# Patient Record
Sex: Female | Born: 1955
Health system: Southern US, Community
[De-identification: ages and names within clinical notes are randomized; demographics above are authoritative.]

## PROBLEM LIST (undated history)

## (undated) DIAGNOSIS — I498 Other specified cardiac arrhythmias: Secondary | ICD-10-CM

## (undated) DIAGNOSIS — F329 Major depressive disorder, single episode, unspecified: Secondary | ICD-10-CM

## (undated) DIAGNOSIS — F32A Depression, unspecified: Secondary | ICD-10-CM

## (undated) DIAGNOSIS — I499 Cardiac arrhythmia, unspecified: Secondary | ICD-10-CM

## (undated) DIAGNOSIS — I10 Essential (primary) hypertension: Secondary | ICD-10-CM

## (undated) HISTORY — PX: CATARACT EXTRACTION: SUR2

---

## 1997-11-17 ENCOUNTER — Ambulatory Visit (HOSPITAL_COMMUNITY): Admission: RE | Admit: 1997-11-17 | Discharge: 1997-11-17 | Payer: Self-pay | Admitting: Obstetrics and Gynecology

## 2000-01-18 ENCOUNTER — Other Ambulatory Visit: Admission: RE | Admit: 2000-01-18 | Discharge: 2000-01-18 | Payer: Self-pay | Admitting: Obstetrics and Gynecology

## 2001-03-13 ENCOUNTER — Other Ambulatory Visit: Admission: RE | Admit: 2001-03-13 | Discharge: 2001-03-13 | Payer: Self-pay | Admitting: Obstetrics and Gynecology

## 2004-02-27 ENCOUNTER — Other Ambulatory Visit: Admission: RE | Admit: 2004-02-27 | Discharge: 2004-02-27 | Payer: Self-pay | Admitting: Obstetrics and Gynecology

## 2004-07-07 ENCOUNTER — Encounter (INDEPENDENT_AMBULATORY_CARE_PROVIDER_SITE_OTHER): Payer: Self-pay | Admitting: *Deleted

## 2004-07-07 ENCOUNTER — Ambulatory Visit (HOSPITAL_COMMUNITY): Admission: RE | Admit: 2004-07-07 | Discharge: 2004-07-07 | Payer: Self-pay | Admitting: Obstetrics and Gynecology

## 2005-09-14 ENCOUNTER — Other Ambulatory Visit: Admission: RE | Admit: 2005-09-14 | Discharge: 2005-09-14 | Payer: Self-pay | Admitting: Obstetrics and Gynecology

## 2006-03-17 ENCOUNTER — Ambulatory Visit: Payer: Self-pay | Admitting: Family Medicine

## 2006-09-21 ENCOUNTER — Ambulatory Visit: Payer: Self-pay | Admitting: Internal Medicine

## 2006-09-27 ENCOUNTER — Ambulatory Visit (HOSPITAL_COMMUNITY): Admission: RE | Admit: 2006-09-27 | Discharge: 2006-09-27 | Payer: Self-pay | Admitting: Internal Medicine

## 2006-09-27 ENCOUNTER — Ambulatory Visit: Payer: Self-pay | Admitting: Cardiology

## 2006-10-27 ENCOUNTER — Ambulatory Visit: Payer: Self-pay | Admitting: Internal Medicine

## 2006-10-31 ENCOUNTER — Encounter: Admission: RE | Admit: 2006-10-31 | Discharge: 2006-10-31 | Payer: Self-pay | Admitting: Internal Medicine

## 2006-11-08 ENCOUNTER — Encounter: Admission: RE | Admit: 2006-11-08 | Discharge: 2006-11-08 | Payer: Self-pay | Admitting: Internal Medicine

## 2006-11-15 ENCOUNTER — Ambulatory Visit (HOSPITAL_COMMUNITY): Admission: RE | Admit: 2006-11-15 | Discharge: 2006-11-15 | Payer: Self-pay | Admitting: Internal Medicine

## 2006-11-27 ENCOUNTER — Ambulatory Visit: Payer: Self-pay | Admitting: Internal Medicine

## 2008-01-14 ENCOUNTER — Ambulatory Visit (HOSPITAL_COMMUNITY): Admission: RE | Admit: 2008-01-14 | Discharge: 2008-01-14 | Payer: Self-pay | Admitting: Chiropractic Medicine

## 2008-09-26 HISTORY — PX: CERVICAL DISCECTOMY: SHX98

## 2009-02-22 ENCOUNTER — Observation Stay (HOSPITAL_COMMUNITY): Admission: EM | Admit: 2009-02-22 | Discharge: 2009-02-23 | Payer: Self-pay | Admitting: Emergency Medicine

## 2009-03-19 ENCOUNTER — Ambulatory Visit (HOSPITAL_COMMUNITY): Admission: RE | Admit: 2009-03-19 | Discharge: 2009-03-20 | Payer: Self-pay | Admitting: Neurological Surgery

## 2009-04-21 ENCOUNTER — Encounter: Admission: RE | Admit: 2009-04-21 | Discharge: 2009-04-21 | Payer: Self-pay | Admitting: Neurological Surgery

## 2009-06-23 ENCOUNTER — Encounter: Admission: RE | Admit: 2009-06-23 | Discharge: 2009-06-23 | Payer: Self-pay | Admitting: Neurological Surgery

## 2009-08-28 ENCOUNTER — Other Ambulatory Visit: Admission: RE | Admit: 2009-08-28 | Discharge: 2009-08-28 | Payer: Self-pay | Admitting: Family Medicine

## 2009-09-22 ENCOUNTER — Encounter: Admission: RE | Admit: 2009-09-22 | Discharge: 2009-09-22 | Payer: Self-pay | Admitting: Neurological Surgery

## 2010-10-17 ENCOUNTER — Encounter: Payer: Self-pay | Admitting: Gastroenterology

## 2010-10-17 ENCOUNTER — Encounter: Payer: Self-pay | Admitting: Endocrinology

## 2010-10-17 ENCOUNTER — Encounter: Payer: Self-pay | Admitting: Internal Medicine

## 2011-01-03 LAB — BASIC METABOLIC PANEL
BUN: 12 mg/dL (ref 6–23)
CO2: 31 mEq/L (ref 19–32)
Chloride: 106 mEq/L (ref 96–112)
Potassium: 4.4 mEq/L (ref 3.5–5.1)

## 2011-01-03 LAB — DIFFERENTIAL
Eosinophils Relative: 2 % (ref 0–5)
Lymphocytes Relative: 27 % (ref 12–46)
Lymphs Abs: 1.8 10*3/uL (ref 0.7–4.0)
Monocytes Absolute: 0.1 10*3/uL (ref 0.1–1.0)

## 2011-01-03 LAB — CBC
HCT: 41.5 % (ref 36.0–46.0)
Hemoglobin: 14.2 g/dL (ref 12.0–15.0)
MCV: 92.1 fL (ref 78.0–100.0)
RDW: 12.6 % (ref 11.5–15.5)
WBC: 6.6 10*3/uL (ref 4.0–10.5)

## 2011-01-04 LAB — POCT I-STAT, CHEM 8
BUN: 19 mg/dL (ref 6–23)
Calcium, Ion: 1.08 mmol/L — ABNORMAL LOW (ref 1.12–1.32)
Chloride: 104 mEq/L (ref 96–112)
Creatinine, Ser: 0.9 mg/dL (ref 0.4–1.2)
Glucose, Bld: 146 mg/dL — ABNORMAL HIGH (ref 70–99)

## 2011-01-04 LAB — ETHANOL

## 2011-01-04 LAB — CBC
HCT: 43.4 % (ref 36.0–46.0)
Hemoglobin: 14.7 g/dL (ref 12.0–15.0)
WBC: 14.1 10*3/uL — ABNORMAL HIGH (ref 4.0–10.5)
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
## 2011-02-11 NOTE — H&P (Signed)
NAME:  Deborah [name], Deborah [name]                ACCOUNT NO.:  [id number]   MEDICAL RECORD NO.:  [id number]          PATIENT TYPE:  AMB   LOCATION:  SDC                           FACILITY:  [facility]   PHYSICIAN:  Dineen Kid. Rana Snare, M.D.    DATE OF BIRTH:  01-Nov-1955   DATE OF ADMISSION:  07/07/2004  DATE OF DISCHARGE:                                HISTORY & PHYSICAL   HISTORY OF PRESENT ILLNESS:  Ms. Deborah [name] is a 55 year old nulligravida white  female who presents to me complaining of irregular cycles.  She has had a  previous evaluation for this, and was placed on cyclical Provera which  brought on her menses, but then she bled for 3 weeks, and it finally  stopped.  She does not have any menopausal symptoms.  She does have some  anxiety, and she is currently on Zoloft for that.  She underwent a  sonohystogram, which shows intracavitary lesions consistent with 2 polyps.  She also has some intramural fibroids, the polyps with the largest measuring  6 mm in size in the posterior endometrium.  She presents today for  definitive surgical evaluation and removal of these, planned hysteroscopy  D&C.   PAST MEDICAL HISTORY:  1.  Significant for overactive bladder.  2.  Anxiety.  3.  Headaches.  4.  Insomnia.   CURRENT MEDICATIONS:  1.  Zoloft 150 mg daily.  2.  Lunesta for sleep.   ALLERGIES:  1.  CODEINE.  2.  SULFA.   PHYSICAL EXAMINATION:  VITAL SIGNS:  Blood pressure is 120/82.  HEART:  Regular rate and rhythm.  LUNGS:  Clear to auscultation bilaterally.  ABDOMEN:  Nondistended, nontender.  PELVIC:  The uterus is anteverted, mobile.  It is 8 weeks size.   Ultrasound shows the uterus measuring 8.17 x 4 x 4.3.  Several intramural  fibroids, with the largest measuring 1.3 cm in size.  The ovaries are normal  in appearance, with a small simple cyst 2.9 cm in size noted on the right  ovary.  Two polypoid masses are seen within the endometrial cavity, with the  largest measuring 6 mm in size.   IMPRESSION AND PLAN:  Menometrorrhagia, fibroids, and endometrial mass,  consistent with polyp.  Plan hysteroscopy D&C for evaluation and removal of  this.  Risks and benefits were discussed at length, which include,  but are not limited to, risk of infection, bleeding, damage to uterus,  tubes, ovaries, bowel, bladder, the possibility this may not alleviate the  bleeding, or possibly this could recur in the future, or require further  surgery.  Her questions were answered.  She has given her informed consent.      DCL/MEDQ  D:  07/06/2004  T:  07/06/2004  Job:  [id number]

## 2011-02-11 NOTE — Assessment & Plan Note (Signed)
[facility]                             PULMONARY OFFICE NOTE   NAME:[name], Deborah                         MRN:          [id number]  DATE:11/14/2006                            DOB:          [birth year]    PROBLEM:  1. Hemoptysis with dyspnea.  2. Multinodular goiter.  3. Hepatic mass.  4. Thoracolumbar disc disease.   HISTORY:  The patient was not seen today but I spoke with her by  telephone and this note is to document for the file.   1. Her original presenting complaint was repeated episodes of      hemoptysis, primarily associated with being on the bottom during      sexual  intercourse.  CT scan of the chest with contrast on September 27, 2006, at the [facility] machine showed ill-defined patchy foci of      ground-glass attenuation in the right upper lobe without other      significant chest findings.  Incidental note was also made of a      left thyroid mass and of a right hepatic lobe mass.  After followup      discussions with her, an ultrasound soft tissue of the head and      neck was done at [facility] on February 13 with results      consistent with a multinodular goiter.  Endocrine appointment has      been made with Dr. Dorisann Frames to follow up this issue.  The      liver lesion was again seen on MR of the abdomen with and without      contrast at [facility] on October 31, 2006.  This described      a 2.1 x 1.6 cm right posterior hepatic lesion.  The radiologist      favored an atypical appearance of an area of focal nodular      hyperplasia and suggested followup MRI in 3-6 months.  She has no      history of liver disease or malignancy.  In discussing this, she      preferred my offer of GI referral for consultation about best      evaluation for liver lesion.  Appointment is being made with      [facility].  2. She is aware of some degenerative problems in her back and no      immediate workup was suggested for  her disc disease, thought by the      radiologist to be at T12-L1.  3. With nonspecific chest x-ray and CT scan findings of the chest, I      have suggested the possibility that the patchy foci of ground-glass      attentuation described in the right upper lobe might represent      hemorrhage.  One possibility would be that pressure on the abdomen      and chest raises intrathoracic blood pressure, probably in the      pulmonary arterial bed, causing hemorrhage.  Since this is  obviously a distressing symptom for her, she is going to go forward      with a pulmonary angiogram, looking in particular for the      possibility of aneurysms or an arteriovenous malformation that      might offer potential for selective embolization or other direct      treatment in the future.  She will keep her scheduled followup      appointment.     Clinton D. Maple Hudson, MD, Tonny Bollman, FACP  Electronically Signed    CDY/MedQ  DD: 11/14/2006  DT: 11/15/2006  Job #: [id number]

## 2011-02-11 NOTE — Assessment & Plan Note (Signed)
[facility]                             PULMONARY OFFICE NOTE   NAME:CLAPP, Deborah [name]                       MRN:          [id number]  DATE:10/27/2006                            DOB:          1956-06-20    PULMONARY FOLLOWUP:   PROBLEM LIST:  Hemoptysis with dyspnea.   HISTORY:  She had originally come because of episodes of dyspnea with  hemoptysis experienced only if she had the bottom position during  intercourse suggesting [name] mechanical pressure effect.  There has been no  change and she feels well and comfortable.   MEDICATIONS:  1. Zoloft 100 mg.  2. Albuterol inhaler.  3. Allegra 180.  4. Excedrin are used p.r.n.   DRUG INTOLERANCE:  1. SULFA.  2. CODEINE.   OBJECTIVE:  VITAL SIGNS:  Weight 154 pounds, BP 142/84, pulse rate 77,  room air saturation 99%.  GENERAL:  There is perhaps mild abdominal obesity, otherwise  unremarkable.  She seems relaxed and comfortable.  HEENT:  Conjunctivae are clear.  Nasal mucosa looks normal.  Oropharynx  is clear.  NECK:  There is no neck vein distention or stridor.  In retrospect I can  appreciate [name] slight fullness of the left lobe of the thyroid.  There are  no bruits at the neck.  HEART:  Sounds are regular without murmur or gallop.  LUNGS:  Fields are clear.  Breathing is unlabored.  ABDOMEN:  I cannot feel enlargement of the liver or spleen.  EXTREMITIES:  Without cyanosis, clubbing, or edema.   LABORATORY:  CBC, coagulation profile, and comprehensive metabolic panel  were all normal.  Pulmonary function tests at [facility] on September 27, 2006,  showed normal spirometry, possibly minimal small airway obstruction with  insignificant response to bronchodilator, normal lung volumes, normal  diffusion.  CT of the chest with contrast at the [facility] on  September 27, 2006, showed ill defined patchy foci of ground glass  attenuation in the right upper lobe.  Given her history, areas of  hemoptysis or  hemorrhage would be [name] consideration.  There was no  evidence of an endobronchial lesion.  [name] little linear scarring at the  left posterior lung base.  There was [name] 1.4 x 2.3-cm oval enhancing  vascular lesion in the posterior aspect of the posterior segment of the  right hepatic lobe, possibility being focal nodular hyperplasia adenoma  or hemangioma according to the radiologist who suggested consideration  of an MRI or followup CT.  There was [name] 1.7 x 1.8-cm solid nodule/mass in  the inferior pole of the left thyroid and minute low density focus in  the right thyroid suggesting thyroid ultrasound.  These were reviewed  with her.   IMPRESSION:  1. Positional dyspnea with hemoptysis.  CT suggests the possibility      that she has an arterial venous malformation in the right upper      lobe and that increased pulmonary vascular pressure may cause      intermittent bleeding.  Further evaluation of this might require [name]      pulmonary angiogram.  2. Thyroid mass.  3. Liver mass.   PLAN:  1. Thyroid ultrasound.  2. MRI of the abdomen/liver.  3. Schedule return one month, earlier p.r.n.     Clinton D. Maple Hudson, MD, Tonny Bollman, FACP  Electronically Signed    CDY/MedQ  DD: 10/28/2006  DT: 10/28/2006  Job #: [id number]

## 2011-02-11 NOTE — Op Note (Signed)
NAME:  CLAPP, Keyanni                ACCOUNT NO.:  [id number]   MEDICAL RECORD NO.:  [id number]          PATIENT TYPE:  AMB   LOCATION:  SDC                           FACILITY:  [facility]   PHYSICIAN:  Dineen Kid. Rana Snare, M.D.    DATE OF BIRTH:  1956-03-21   DATE OF PROCEDURE:  07/07/2004  DATE OF DISCHARGE:                                 OPERATIVE REPORT   PREOPERATIVE DIAGNOSES:  1.  Abnormal uterine bleeding.  2.  Endometrial mass consistent with polyps.  3.  Fibroid.   POSTOPERATIVE DIAGNOSES:  1.  Abnormal uterine bleeding.  2.  Endometrial mass consistent with polyps.  3.  Fibroid.   PROCEDURES:  Hysteroscopy, dilatation and curettage.   SURGEON:  Dineen Kid. Rana Snare, M.D.   ANESTHESIA:  Monitored anesthetic care and paracervical block.   INDICATIONS:  Ms. Coralee North is a 55 year old nulligravida with worsening  bleeding, actually made worse with hormonal therapy.  Underwent a  sonohysterogram, which shows intramural fibroids but also two endometrial  masses consistent with polyps.  She presents today for hysteroscopy, D&C,  for evaluation and removal of these.  Risks and benefits were discussed,  informed consent was obtained.   Findings at the time of surgery were two posterior endometrial wall  thickenings consistent with early polyps, otherwise normal-appearing tissue  and normal-appearing endometrial cavity after D&C.   DESCRIPTION OF PROCEDURE:  After adequate analgesia, the patient placed in  the dorsal lithotomy position, where she is sterilely prepped and draped,  the bladder is sterilely drained, a Graves speculum is placed.  A tenaculum  is placed on the anterior lip of the cervix.  A paracervical block was  placed.  The uterus is sounded to 7 cm.  The cervix is dilated to a #27  Pratt dilator.  The hysteroscope is inserted and the above findings were  noted.  Sharp curettage was performed, retrieving small fragments of  endometrium and probable early endometrial polyps.  This  is performed until  a gritty surface is felt throughout the endometrial cavity, followed by  hysteroscopy revealing a normal-appearing endometrial cavity and no residual  polyps noted.  The cervix was also noted to be normal.  The hysteroscope was  removed.  Hemostasis was achieved within the cervix with Monsel's solution.  The tenaculum was removed from the cervix and noted to be hemostatic.  The  speculum was then removed and the patient was transferred to the recovery  room in stable condition.  Estimated blood loss from the procedure was 20  mL, sorbitol deficit 10 mL.  The patient received 30 mg of Toradol in the OR  as well.   DISPOSITION:  The patient will be discharged home, will follow up in the  office in two to three weeks, sent home with a routine instruction sheet for  D&C.  Told to return for increased pain, fever, or bleeding.      DCL/MEDQ  D:  07/07/2004  T:  07/07/2004  Job:  [id number]

## 2011-02-11 NOTE — Assessment & Plan Note (Signed)
[facility]                             PULMONARY OFFICE NOTE   NAME:[name], [name]                       MRN:          [id number]  DATE:09/21/2006                            DOB:          05/11/1956    PROBLEM:  A 55 year old woman, self-referred, because of hemoptysis. She  is changing primary physicians and currently un-established.   HISTORY:  She gives a long history of second-hand smoke exposure and had  smoked herself as a young adult, but has had no known lung disease.  Since June of this year, she has had 3 separate episodes of similar  pattern where she felt a bubbling in her chest, shortness of breath, and  then red blood from her mouth while having intercourse. She feels it is  the weight of her partner somehow causing the bleeding. On the first  occasion, she sat up all night and the bleeding gradually stopped. On  the other two occasions, it lasted for shorter length of time. There is  no association with menses and there has been no other bleeding. She had  no similar previous experience. A nurse practitioner at her former  primary office sent her for a chest x-ray and she brings a mini copy  done at [place], but not dated. I see no obvious lesion on the PA and  lateral fields represented. She says since the first of these episodes,  she has had a sense that her chest was more tight than usual. She has  not been waking smothered. Perhaps unrelated, she was noted to be in  asymptomatic ventricular trigeminy during a routine colonoscopy while  lying on her side earlier this year. She has not needed to sleep propped  up. She has not had chest pain or palpitations.   MEDICATIONS:  1. Zoloft 100 mg.  2. Albuterol rescue inhaler given for the present complaint.  3. Allegra 180 mg.  4. Occasional Excedrin.   DRUG INTOLERANCES:  TO SULFA AND TO CODEINE.   REVIEW OF SYSTEMS:  She feels she is perimenopausal with occasional  night sweats  and hot flashes. Weight has been stable. No adenopathy or  rash. No other bleeding. No chest pain or palpitations, syncope or  confusion. No change in bowel or bladder.   PAST HISTORY:  No significant health problems. Seasonal rhinitis and  some persistent sense of sinus congestion. Remote gastric ulcer. No  history of lung disease, endometriosis, cardiac, liver or kidney  disease, diabetes or cancer. No clotting disorder. A PPD was negative.   SOCIAL HISTORY:  She smoked between ages 66 and 47. She grew up in a  smoking household and lived with smokers through much of her adult life.  She is divorced with no children. Now dating a patient of ours who  referred her here. She works at a dialysis center.   FAMILY HISTORY:  Several smokers with COPD. Nobody with a bleeding or  clotting disorder.   OBJECTIVE:  Weight 157 pounds. BP 138/82; pulse regular at 68.  Room air  saturation 100% at rest on room air. This is  a well-developed, well-  nourished, pleasant, calm woman.  SKIN: No rash. No telangiectasias at the nail beds.  No adenopathy.  HEENT: Conjunctivae are clear. Nasal mucosa looks normal. Oropharynx is  normal.  There is no neck vein distention, stridor or thyromegaly.  CHEST: Quiet, clear lung fields. No rales, rhonchi, wheeze or cough.  Heart sounds are regular without murmur or gallop. I do not hear an  obvious bruit over the chest.  ABDOMEN: No enlargement of liver or spleen.  EXTREMITIES: No cyanosis, clubbing or edema.   LABORATORY DATA:  As she left, she was sent down for blood work, which  is available by the time of this dictation, showing a completely normal  CBC with hemoglobin of 13.4, normal coagulation panel with an INR of 1  and normal chemistry panel except that alkaline phosphatase is  borderline low at 37. Other liver enzymes and renal function are normal.   IMPRESSION:  Hemoptysis, specifically associated with intercourse with  partner on top of her  suggesting that there is a mechanical or pressure  effect. An endobronchial carcinoid or similar vascular tumor is not  excluded, but this may be simply an arteriovenous malformation.   PLAN:  1. Blood work was drawn and is reported.  2. Schedule CT of the chest with contrast.  3. Schedule PFT.  4. Schedule return 1 month, earlier as needed. We have discussed      possible bronchoscopy.     Clinton D. Maple Hudson, MD, Tonny Bollman, FACP  Electronically Signed    CDY/MedQ  DD: 09/21/2006  DT: 09/21/2006  Job #: [id number]

## 2011-02-11 NOTE — Assessment & Plan Note (Signed)
[facility]                             PULMONARY OFFICE NOTE   NAME:[name], [name]                       MRN:          [id number]  DATE:11/27/2006                            DOB:          [birth year]    PROBLEMS:  1. Hemoptysis with dyspnea.  2. Multinodular goiter.  3. Hepatic mass.  4. Thoracolumbar disk disease.  5. Mild pulmonary hypertension.   HISTORY:  Her pulmonary angiogram did not suggest an AV malformation or  other bleeding source.  She had some ectopy, apparently, during the  test.  It demonstrated mild pulmonary hypertension at 45/19 with a mean  PA pressure of 30 mm.  She is pending a visit to Dr. Talmage Nap for a thyroid  nodule.  We had discussed the hepatic lesion seen on MR of the abdomen,  done at [facility] on February 5th and described as a 2.1 x 1.6  cm right posterior hepatic lesion.  Incidentally, she was also seen to  have some disk disease which is asymptomatic at the time.  I offered GI  referral for evaluation of the liver, and she wanted this done.  There  has been no more hemoptysis.   MEDICATIONS:  1. Zoloft 100 mg.  2. Albuterol rescue inhaler used p.r.n.  3. Allegra 180 mg p.r.n.  4. Excedrin p.r.n.   DRUG INTOLERANCE:  SULFA, CODEINE.   OBJECTIVE:  VITAL SIGNS:  Weight 158 pounds.  BP 126/82, pulse regular  at 88, room air saturation 98%.  GENERAL:  She looks well.  There is no obvious rash or adenopathy.  Nailbeds are normal.  I do not see telangiectases.  HEENT:  Nose and  throat are clear.  LUNGS:  Lung fields sound clear.  HEART:  The heart sounds regular without murmur.  ABDOMEN:  I do not hear a bruit over her abdomen.   IMPRESSION:  Hemoptysis was clearly related to mechanical pressure in  the bottom position during intercourse, but we have not found a bleeding  source.  At the present time, she simply will avoid that position.  Incidental problems, as noted above, have appeared during the  workup.  She is being evaluated by Dr. Talmage Nap for the thyroid nodules, and she had  previously seen Dr. Vida Rigger for GI, so we will help her get back to  him for evaluation of the liver lesion seen on  MR of the abdomen.  Schedule return here in three months.  If there is  further hemoptysis, we will offer bronchoscopy.     Clinton D. Maple Hudson, MD, Tonny Bollman, FACP  Electronically Signed    CDY/MedQ  DD: 11/30/2006  DT: 12/01/2006  Job #: [id number]   cc:   Dorisann Frames, M.D.  Petra Kuba, M.D.

## 2011-07-11 ENCOUNTER — Other Ambulatory Visit: Payer: Self-pay | Admitting: Family Medicine

## 2011-07-11 ENCOUNTER — Other Ambulatory Visit (HOSPITAL_COMMUNITY)
Admission: RE | Admit: 2011-07-11 | Discharge: 2011-07-11 | Disposition: A | Discharge status: Home / Self Care | Payer: BC Managed Care – PPO | Source: Ambulatory Visit | Attending: Family Medicine | Admitting: Family Medicine

## 2011-07-11 DIAGNOSIS — Z124 Encounter for screening for malignant neoplasm of cervix: Secondary | ICD-10-CM | POA: Insufficient documentation

## 2012-03-15 ENCOUNTER — Emergency Department (HOSPITAL_COMMUNITY)
Admission: EM | Admit: 2012-03-15 | Discharge: 2012-03-16 | Disposition: A | Discharge status: Home / Self Care | Payer: BC Managed Care – PPO | Attending: Emergency Medicine | Admitting: Emergency Medicine

## 2012-03-15 ENCOUNTER — Emergency Department (HOSPITAL_COMMUNITY)
Admission: EM | Admit: 2012-03-15 | Discharge: 2012-03-15 | Disposition: A | Discharge status: Nursing Home (Includes ICF) | Payer: BC Managed Care – PPO | Source: Home / Self Care | Attending: Emergency Medicine | Admitting: Emergency Medicine

## 2012-03-15 ENCOUNTER — Encounter (HOSPITAL_COMMUNITY): Payer: Self-pay | Admitting: Emergency Medicine

## 2012-03-15 DIAGNOSIS — I498 Other specified cardiac arrhythmias: Secondary | ICD-10-CM | POA: Insufficient documentation

## 2012-03-15 DIAGNOSIS — R202 Paresthesia of skin: Secondary | ICD-10-CM

## 2012-03-15 DIAGNOSIS — R209 Unspecified disturbances of skin sensation: Secondary | ICD-10-CM

## 2012-03-15 DIAGNOSIS — Z79899 Other long term (current) drug therapy: Secondary | ICD-10-CM | POA: Insufficient documentation

## 2012-03-15 DIAGNOSIS — IMO0001 Reserved for inherently not codable concepts without codable children: Secondary | ICD-10-CM

## 2012-03-15 DIAGNOSIS — F3289 Other specified depressive episodes: Secondary | ICD-10-CM | POA: Insufficient documentation

## 2012-03-15 DIAGNOSIS — I10 Essential (primary) hypertension: Secondary | ICD-10-CM | POA: Insufficient documentation

## 2012-03-15 DIAGNOSIS — F329 Major depressive disorder, single episode, unspecified: Secondary | ICD-10-CM | POA: Insufficient documentation

## 2012-03-15 HISTORY — DX: Other specified cardiac arrhythmias: I49.8

## 2012-03-15 HISTORY — DX: Essential (primary) hypertension: I10

## 2012-03-15 HISTORY — DX: Cardiac arrhythmia, unspecified: I49.9

## 2012-03-15 HISTORY — DX: Major depressive disorder, single episode, unspecified: F32.9

## 2012-03-15 HISTORY — DX: Depression, unspecified: F32.A

## 2012-03-15 LAB — POCT I-STAT, CHEM 8
BUN: 17 mg/dL (ref 6–23)
Calcium, Ion: 1.2 mmol/L (ref 1.12–1.32)
Chloride: 102 meq/L (ref 96–112)
Creatinine, Ser: 0.9 mg/dL (ref 0.50–1.10)
Glucose, Bld: 83 mg/dL (ref 70–99)
HCT: 40 % (ref 36.0–46.0)
Hemoglobin: 13.6 g/dL (ref 12.0–15.0)
Potassium: 3.8 meq/L (ref 3.5–5.1)
Sodium: 143 meq/L (ref 135–145)
TCO2: 28 mmol/L (ref 0–100)

## 2012-03-15 NOTE — ED Provider Notes (Signed)
History     CSN: [id number]  Arrival date & time 03/15/12  1840   First MD Initiated Contact with Patient 03/15/12 1848      Chief Complaint  Patient presents with  . Tingling    (Consider location/radiation/quality/duration/timing/severity/associated sxs/prior treatment) HPI Comments: Patient presents tonight urgent care complaining of sudden onset of tingling and numbness sensations on both her upper extremities and both lower extremities at this moment she feels that her left lower leg has returned to normal. While the rest of her upper extremities both arms all way down to her hands are numb and tingling along with her right leg.  Patient denies any headache, visual changes, muscular weakness. " I have never felt anything like this before". Patient denies any constitutional symptoms such as fevers, bodyaches chills malaise or unintentional weight loss. Denies any disequilibrium.  The history is provided by the patient.    Past Medical History  Diagnosis Date  . Hypertension   . Depression     Past Surgical History  Procedure Date  . Cervical discectomy 2010    No family history on file.  History  Substance Use Topics  . Smoking status: Never Smoker   . Smokeless tobacco: Not on file  . Alcohol Use: Yes    OB History    Grav Para Term Preterm Abortions TAB SAB Ect Mult Living                  Review of Systems  Constitutional: Negative for chills, diaphoresis, activity change, appetite change and fatigue.  Respiratory: Negative for shortness of breath.   Cardiovascular: Negative for chest pain and leg swelling.  Gastrointestinal: Negative for abdominal distention.  Musculoskeletal: Negative for back pain, arthralgias and gait problem.  Neurological: Positive for numbness. Negative for dizziness, tremors, seizures, syncope, facial asymmetry, speech difficulty, weakness and headaches.    Allergies  Codeine and Sulfa antibiotics  Home Medications    Current Outpatient Rx  Name Route Sig Dispense Refill  . AMLODIPINE BESY-BENAZEPRIL HCL 5-10 MG PO CAPS Oral Take 1 capsule by mouth daily.    Marland Kitchen GABAPENTIN 100 MG PO CAPS Oral Take 100 mg by mouth 3 (three) times daily.    . SERTRALINE HCL 100 MG PO TABS Oral Take 100 mg by mouth daily.    Marland Kitchen SIMVASTATIN 20 MG PO TABS Oral Take 20 mg by mouth every evening.    Marland Kitchen SOLIFENACIN SUCCINATE 10 MG PO TABS Oral Take 10 mg by mouth daily.      BP 153/77  Pulse 68  Temp 97.1 F (36.2 C) (Oral)  Resp 18  SpO2 98%  Physical Exam  Nursing note and vitals reviewed. Constitutional: She is oriented to person, place, and time. Vital signs are normal. She appears well-developed and well-nourished. She appears lethargic.  Non-toxic appearance. She does not have a sickly appearance. She does not appear ill. No distress.  HENT:  Head: Normocephalic.  Mouth/Throat: No oropharyngeal exudate.  Eyes: Conjunctivae and EOM are normal. Pupils are equal, round, and reactive to light.  Neurological: She is oriented to person, place, and time. She has normal strength. She appears lethargic. She displays tremor. She displays no atrophy. No cranial nerve deficit or sensory deficit. She exhibits normal muscle tone. She displays a negative Romberg sign. She displays no seizure activity. Coordination and gait normal.  Skin: Skin is warm. No rash noted. No erythema.    ED Course  Procedures (including critical care time)   Labs Reviewed  POCT I-STAT, CHEM 8   No results found.   1. Paresthesias/numbness       MDM  Generalized paresthesias without muscular weakness or cranial nerve deficits. Patient has been transferred to the emergency department for nearly onset neurological symptoms. Patient was screened for electrolytes abnormalities and her initial neurological exam was unremarkable. Patient with conserved if proprioception and stereognosis.        Jimmie Molly, MD 03/15/12 1949

## 2012-03-15 NOTE — ED Notes (Addendum)
PT HERE WITH SUDDEN ONSET TINGLING THAT STARTED IN R HAND THEN PROGRESSED TO NECK/BACK AND LEGS.STATES SHE HAD EPISODE OF TINGLING S/P RESIDUAL FROM CERVICAL DISSECTION 2010 BUT NEVER ALL OVER.TAKING PRESCRIBED GABAPENTIN BID.DENIES CP OR SOB

## 2012-03-15 NOTE — ED Notes (Signed)
Pt reports that she has been having numbness/tingling in bil arms and legs- started this AM; went to [facility]- drew blood work; denies weakness, only tingling; reports had SOB and heart fluttering yesterday; denies CP;

## 2012-03-16 ENCOUNTER — Encounter (HOSPITAL_COMMUNITY): Payer: Self-pay | Admitting: Radiology

## 2012-03-16 ENCOUNTER — Emergency Department (HOSPITAL_COMMUNITY): Payer: BC Managed Care – PPO

## 2012-03-16 LAB — URINALYSIS, ROUTINE W REFLEX MICROSCOPIC
Bilirubin Urine: NEGATIVE
Hgb urine dipstick: NEGATIVE
Ketones, ur: NEGATIVE mg/dL
Protein, ur: NEGATIVE mg/dL
Urobilinogen, UA: 0.2 mg/dL (ref 0.0–1.0)

## 2012-03-16 LAB — URINE MICROSCOPIC-ADD ON

## 2012-03-16 NOTE — ED Provider Notes (Signed)
History     CSN: [id number]  Arrival date & time 03/15/12  2016   First MD Initiated Contact with Patient 03/15/12 2355      Chief Complaint  Patient presents with  . Tingling    (Consider location/radiation/quality/duration/timing/severity/associated sxs/prior treatment) HPI Patient reports sensation of tingling and numbness in both her arms and both her legs which started earlier today. She states that years ago she had a discectomy of her cervical spine and occasionally has tingling and numbness in her right upper extremity. She states that the symptoms in all 4 extremities are new. She has had no weakness of her arms or legs. No changes in her vision or speech. No change in her baseline neck pain. She was seen in urgent care and referred to the ED for further evaluation.  Upon my evaluation in the ED her symptoms had moslty resolved- primarily tingling in right UE.  There are no alleviating or modifying factors, there are no associated systemic symptoms  Past Medical History  Diagnosis Date  . Hypertension   . Depression   . Bigeminy     Past Surgical History  Procedure Date  . Cervical discectomy 2010    History reviewed. No pertinent family history.  History  Substance Use Topics  . Smoking status: Never Smoker   . Smokeless tobacco: Not on file  . Alcohol Use: Yes     occasion    OB History    Grav Para Term Preterm Abortions TAB SAB Ect Mult Living                  Review of Systems ROS reviewed and all otherwise negative except for mentioned in HPI  Allergies  Codeine and Sulfa antibiotics  Home Medications   Current Outpatient Rx  Name Route Sig Dispense Refill  . AMLODIPINE BESY-BENAZEPRIL HCL 5-10 MG PO CAPS Oral Take 1 capsule by mouth daily.    Marland Kitchen GABAPENTIN 100 MG PO CAPS Oral Take 100 mg by mouth 3 (three) times daily.    . SERTRALINE HCL 100 MG PO TABS Oral Take 100 mg by mouth daily.    Marland Kitchen SIMVASTATIN 20 MG PO TABS Oral Take 20 mg by mouth  every evening.    Marland Kitchen SOLIFENACIN SUCCINATE 10 MG PO TABS Oral Take 10 mg by mouth daily.      BP 141/66  Pulse 63  Temp 97.5 F (36.4 C) (Oral)  Resp 16  SpO2 99% Vitals reviewed Physical Exam Physical Examination: General appearance - alert, well appearing, and in no distress Mental status - alert, oriented to person, place, and time Eyes - pupils equal and reactive, extraocular eye movements intact Mouth - mucous membranes moist, pharynx normal without lesions Chest - clear to auscultation, no wheezes, rales or rhonchi, symmetric air entry Heart - normal rate, regular rhythm, normal S1, S2, no murmurs, rubs, clicks or gallops Abdomen - soft, nontender, nondistended, no masses or organomegaly Neurological - alert, oriented, normal speech, cranial nerves 2-12 tested and intact, strength 5/5 in extremities x 4, sensation intact to light touch in extrmeities x 4 Musculoskeletal - no joint tenderness, deformity or swelling Extremities - peripheral pulses normal, no pedal edema, no clubbing or cyanosis Skin - normal coloration and turgor, no rashes  ED Course  Procedures (including critical care time)  Labs Reviewed  URINALYSIS, ROUTINE W REFLEX MICROSCOPIC - Abnormal; Notable for the following:    Leukocytes, UA MODERATE (*)     All other components within normal limits  URINE MICROSCOPIC-ADD ON - Abnormal; Notable for the following:    Squamous Epithelial / LPF FEW (*)     All other components within normal limits  LAB REPORT - SCANNED   Ct Head Wo Contrast  03/16/2012  *RADIOLOGY REPORT*  Clinical Data:  Sudden onset of right hand tingling, progressing to the neck, back, and legs.  Patient on Gabapentin.  Assess for intracranial or cervical pathology.  CT HEAD WITHOUT CONTRAST AND CT CERVICAL SPINE WITHOUT CONTRAST  Technique:  Multidetector CT imaging of the head and cervical spine was performed following the standard protocol without intravenous contrast.  Multiplanar CT image  reconstructions of the cervical spine were also generated.  Comparison: CT of the head and cervical spine, and MRI of the cervical spine, performed 02/22/2009  CT HEAD  Findings: There is no evidence of acute infarction, mass lesion, or intra- or extra-axial hemorrhage on CT.  The posterior fossa, including the cerebellum, brainstem and fourth ventricle, is within normal limits.  The third and lateral ventricles, and basal ganglia are unremarkable in appearance.  The cerebral hemispheres are symmetric in appearance, with normal gray- white differentiation.  No mass effect or midline shift is seen.  There is no evidence of fracture; visualized osseous structures are unremarkable in appearance.  The visualized portions of the orbits are within normal limits.  The paranasal sinuses and mastoid air cells are well-aerated.  No significant soft tissue abnormalities are seen.  IMPRESSION: Unremarkable noncontrast CT of the head.  CT CERVICAL SPINE  Findings: There is no evidence of fracture or subluxation.  The patient is status post anterior cervical spinal fusion at C5-C7. Vertebral bodies demonstrate normal height and alignment. Intervertebral disc spaces are preserved.  Prevertebral soft tissues are within normal limits.  The visualized neural foramina are grossly unremarkable.  The cervical cord is difficult to fully assess on CT.  The thyroid gland is unremarkable in appearance.  The visualized lung apices are clear.  No significant soft tissue abnormalities are seen.  IMPRESSION:  1.  No evidence of fracture or subluxation along the cervical spine; status post anterior fusion at C5-C7. 2.  1.0 cm hypodensity within the left thyroid lobe.  Consider further evaluation with thyroid ultrasound.  If patient is clinically hyperthyroid, consider nuclear medicine thyroid uptake and scan.  Original Report Authenticated By: Tonia Ghent, M.D.   Ct Cervical Spine Wo Contrast  03/16/2012  *RADIOLOGY REPORT*  Clinical Data:   Sudden onset of right hand tingling, progressing to the neck, back, and legs.  Patient on Gabapentin.  Assess for intracranial or cervical pathology.  CT HEAD WITHOUT CONTRAST AND CT CERVICAL SPINE WITHOUT CONTRAST  Technique:  Multidetector CT imaging of the head and cervical spine was performed following the standard protocol without intravenous contrast.  Multiplanar CT image reconstructions of the cervical spine were also generated.  Comparison: CT of the head and cervical spine, and MRI of the cervical spine, performed 02/22/2009  CT HEAD  Findings: There is no evidence of acute infarction, mass lesion, or intra- or extra-axial hemorrhage on CT.  The posterior fossa, including the cerebellum, brainstem and fourth ventricle, is within normal limits.  The third and lateral ventricles, and basal ganglia are unremarkable in appearance.  The cerebral hemispheres are symmetric in appearance, with normal gray- white differentiation.  No mass effect or midline shift is seen.  There is no evidence of fracture; visualized osseous structures are unremarkable in appearance.  The visualized portions of the orbits are within normal  limits.  The paranasal sinuses and mastoid air cells are well-aerated.  No significant soft tissue abnormalities are seen.  IMPRESSION: Unremarkable noncontrast CT of the head.  CT CERVICAL SPINE  Findings: There is no evidence of fracture or subluxation.  The patient is status post anterior cervical spinal fusion at C5-C7. Vertebral bodies demonstrate normal height and alignment. Intervertebral disc spaces are preserved.  Prevertebral soft tissues are within normal limits.  The visualized neural foramina are grossly unremarkable.  The cervical cord is difficult to fully assess on CT.  The thyroid gland is unremarkable in appearance.  The visualized lung apices are clear.  No significant soft tissue abnormalities are seen.  IMPRESSION:  1.  No evidence of fracture or subluxation along the cervical  spine; status post anterior fusion at C5-C7. 2.  1.0 cm hypodensity within the left thyroid lobe.  Consider further evaluation with thyroid ultrasound.  If patient is clinically hyperthyroid, consider nuclear medicine thyroid uptake and scan.  Original Report Authenticated By: Tonia Ghent, M.D.     1. Paresthesia       MDM  Pt presenting with c/o tingling in 4 extremities, primarily right arm- all has resolved upon my evaluation in the ED except right arm/hand tingling which is chronic since cervical diskectomy. Neuro exam normal including cranial nerves, sensation to light touch and strength.  CT scans reassuring, electrolytes normal when checked at [facility].  Pt discharged with strict return precautions, she is advised to arrange for follow up with her PMD.  She is agreeable with this plan.         Ethelda Chick, MD 03/17/12 [phone]

## 2012-03-16 NOTE — Discharge Instructions (Signed)
Return to the ED with any concerns including chest pain, fainting, weakness in arms or legs, changes in speech or vision, decreased level of alertness/lethargy, or any other alarming symptoms  You should be sure to arrange for a recheck with your primary care doctor  The CT scan did not show any acute findings in your brain or cervical spine.  However, there was a thryoid nodule seen on CT scan which should be evaluated further with ultrasund- this should be arranged through your primary care physician

## 2012-03-16 NOTE — ED Notes (Signed)
Patient transported to CT 

## 2012-03-26 ENCOUNTER — Other Ambulatory Visit: Payer: Self-pay | Admitting: Family Medicine

## 2012-03-26 DIAGNOSIS — E041 Nontoxic single thyroid nodule: Secondary | ICD-10-CM

## 2012-04-12 ENCOUNTER — Ambulatory Visit
Admission: RE | Admit: 2012-04-12 | Discharge: 2012-04-12 | Disposition: A | Discharge status: Home / Self Care | Payer: BC Managed Care – PPO | Source: Ambulatory Visit | Attending: Family Medicine | Admitting: Family Medicine

## 2012-04-12 DIAGNOSIS — E041 Nontoxic single thyroid nodule: Secondary | ICD-10-CM

## 2012-04-30 ENCOUNTER — Other Ambulatory Visit: Payer: Self-pay | Admitting: Family Medicine

## 2012-04-30 DIAGNOSIS — E041 Nontoxic single thyroid nodule: Secondary | ICD-10-CM

## 2012-05-23 ENCOUNTER — Ambulatory Visit
Admission: RE | Admit: 2012-05-23 | Discharge: 2012-05-23 | Disposition: A | Discharge status: Home / Self Care | Payer: BC Managed Care – PPO | Source: Ambulatory Visit | Attending: Family Medicine | Admitting: Family Medicine

## 2012-05-23 ENCOUNTER — Other Ambulatory Visit (HOSPITAL_COMMUNITY)
Admission: RE | Admit: 2012-05-23 | Discharge: 2012-05-23 | Disposition: A | Discharge status: Home / Self Care | Payer: BC Managed Care – PPO | Source: Ambulatory Visit | Attending: Interventional Radiology | Admitting: Interventional Radiology

## 2012-05-23 DIAGNOSIS — E049 Nontoxic goiter, unspecified: Secondary | ICD-10-CM | POA: Insufficient documentation

## 2012-05-23 DIAGNOSIS — E041 Nontoxic single thyroid nodule: Secondary | ICD-10-CM

## 2014-05-30 ENCOUNTER — Other Ambulatory Visit: Payer: Self-pay | Admitting: Family Medicine

## 2014-05-30 DIAGNOSIS — Z1231 Encounter for screening mammogram for malignant neoplasm of breast: Secondary | ICD-10-CM

## 2014-06-04 ENCOUNTER — Ambulatory Visit
Admission: RE | Admit: 2014-06-04 | Discharge: 2014-06-04 | Disposition: A | Discharge status: Home / Self Care | Payer: BC Managed Care – PPO | Source: Ambulatory Visit | Attending: Family Medicine | Admitting: Family Medicine

## 2014-06-04 DIAGNOSIS — Z1231 Encounter for screening mammogram for malignant neoplasm of breast: Secondary | ICD-10-CM

## 2014-08-25 ENCOUNTER — Other Ambulatory Visit: Payer: Self-pay | Admitting: Family Medicine

## 2014-08-25 ENCOUNTER — Other Ambulatory Visit (HOSPITAL_COMMUNITY)
Admission: RE | Admit: 2014-08-25 | Discharge: 2014-08-25 | Disposition: A | Discharge status: Home / Self Care | Payer: BC Managed Care – PPO | Source: Ambulatory Visit | Attending: Family Medicine | Admitting: Family Medicine

## 2014-08-25 DIAGNOSIS — Z124 Encounter for screening for malignant neoplasm of cervix: Secondary | ICD-10-CM | POA: Diagnosis present

## 2014-08-25 DIAGNOSIS — Z1151 Encounter for screening for human papillomavirus (HPV): Secondary | ICD-10-CM | POA: Insufficient documentation

## 2014-08-27 LAB — CYTOLOGY - PAP

## 2015-11-17 ENCOUNTER — Other Ambulatory Visit: Payer: Self-pay | Admitting: Family Medicine

## 2015-11-17 DIAGNOSIS — E041 Nontoxic single thyroid nodule: Secondary | ICD-10-CM

## 2015-11-17 DIAGNOSIS — Z1231 Encounter for screening mammogram for malignant neoplasm of breast: Secondary | ICD-10-CM

## 2015-11-30 ENCOUNTER — Ambulatory Visit: Payer: Self-pay

## 2015-11-30 ENCOUNTER — Other Ambulatory Visit: Payer: Self-pay

## 2015-12-17 ENCOUNTER — Ambulatory Visit
Admission: RE | Admit: 2015-12-17 | Discharge: 2015-12-17 | Disposition: A | Discharge status: Home / Self Care | Payer: BLUE CROSS/BLUE SHIELD | Source: Ambulatory Visit | Attending: Family Medicine | Admitting: Family Medicine

## 2015-12-17 DIAGNOSIS — E041 Nontoxic single thyroid nodule: Secondary | ICD-10-CM

## 2015-12-17 DIAGNOSIS — Z1231 Encounter for screening mammogram for malignant neoplasm of breast: Secondary | ICD-10-CM

## 2016-09-30 DIAGNOSIS — G4733 Obstructive sleep apnea (adult) (pediatric): Secondary | ICD-10-CM | POA: Diagnosis not present

## 2016-10-02 DIAGNOSIS — G4733 Obstructive sleep apnea (adult) (pediatric): Secondary | ICD-10-CM | POA: Diagnosis not present

## 2016-10-03 DIAGNOSIS — G4733 Obstructive sleep apnea (adult) (pediatric): Secondary | ICD-10-CM | POA: Diagnosis not present

## 2016-10-04 DIAGNOSIS — G4733 Obstructive sleep apnea (adult) (pediatric): Secondary | ICD-10-CM | POA: Diagnosis not present

## 2016-11-02 DIAGNOSIS — G4733 Obstructive sleep apnea (adult) (pediatric): Secondary | ICD-10-CM | POA: Diagnosis not present

## 2016-11-29 ENCOUNTER — Other Ambulatory Visit: Payer: Self-pay | Admitting: Family Medicine

## 2016-11-29 DIAGNOSIS — E041 Nontoxic single thyroid nodule: Secondary | ICD-10-CM

## 2016-11-30 DIAGNOSIS — G4733 Obstructive sleep apnea (adult) (pediatric): Secondary | ICD-10-CM | POA: Diagnosis not present

## 2016-12-07 ENCOUNTER — Other Ambulatory Visit: Payer: BLUE CROSS/BLUE SHIELD

## 2016-12-31 DIAGNOSIS — G4733 Obstructive sleep apnea (adult) (pediatric): Secondary | ICD-10-CM | POA: Diagnosis not present

## 2017-01-30 DIAGNOSIS — G4733 Obstructive sleep apnea (adult) (pediatric): Secondary | ICD-10-CM | POA: Diagnosis not present

## 2017-02-03 ENCOUNTER — Other Ambulatory Visit: Payer: BLUE CROSS/BLUE SHIELD

## 2017-02-24 ENCOUNTER — Ambulatory Visit
Admission: RE | Admit: 2017-02-24 | Discharge: 2017-02-24 | Disposition: A | Discharge status: Home / Self Care | Payer: 59 | Source: Ambulatory Visit | Attending: Family Medicine | Admitting: Family Medicine

## 2017-02-24 DIAGNOSIS — E041 Nontoxic single thyroid nodule: Secondary | ICD-10-CM

## 2017-02-24 DIAGNOSIS — E042 Nontoxic multinodular goiter: Secondary | ICD-10-CM | POA: Diagnosis not present

## 2017-03-01 ENCOUNTER — Other Ambulatory Visit: Payer: Self-pay | Admitting: Family Medicine

## 2017-03-01 DIAGNOSIS — E782 Mixed hyperlipidemia: Secondary | ICD-10-CM | POA: Diagnosis not present

## 2017-03-01 DIAGNOSIS — E041 Nontoxic single thyroid nodule: Secondary | ICD-10-CM | POA: Diagnosis not present

## 2017-03-01 DIAGNOSIS — I10 Essential (primary) hypertension: Secondary | ICD-10-CM | POA: Diagnosis not present

## 2017-03-01 DIAGNOSIS — Z1231 Encounter for screening mammogram for malignant neoplasm of breast: Secondary | ICD-10-CM

## 2017-03-01 DIAGNOSIS — Z0000 Encounter for general adult medical examination without abnormal findings: Secondary | ICD-10-CM | POA: Diagnosis not present

## 2017-03-02 DIAGNOSIS — G4733 Obstructive sleep apnea (adult) (pediatric): Secondary | ICD-10-CM | POA: Diagnosis not present

## 2017-03-20 ENCOUNTER — Ambulatory Visit
Admission: RE | Admit: 2017-03-20 | Discharge: 2017-03-20 | Disposition: A | Discharge status: Home / Self Care | Payer: 59 | Source: Ambulatory Visit | Attending: Family Medicine | Admitting: Family Medicine

## 2017-03-20 DIAGNOSIS — Z1231 Encounter for screening mammogram for malignant neoplasm of breast: Secondary | ICD-10-CM

## 2017-04-01 DIAGNOSIS — G4733 Obstructive sleep apnea (adult) (pediatric): Secondary | ICD-10-CM | POA: Diagnosis not present

## 2017-04-19 DIAGNOSIS — G4733 Obstructive sleep apnea (adult) (pediatric): Secondary | ICD-10-CM | POA: Diagnosis not present

## 2017-05-02 DIAGNOSIS — G4733 Obstructive sleep apnea (adult) (pediatric): Secondary | ICD-10-CM | POA: Diagnosis not present

## 2017-06-02 DIAGNOSIS — G4733 Obstructive sleep apnea (adult) (pediatric): Secondary | ICD-10-CM | POA: Diagnosis not present

## 2017-07-02 DIAGNOSIS — G4733 Obstructive sleep apnea (adult) (pediatric): Secondary | ICD-10-CM | POA: Diagnosis not present

## 2017-08-02 DIAGNOSIS — G4733 Obstructive sleep apnea (adult) (pediatric): Secondary | ICD-10-CM | POA: Diagnosis not present

## 2017-10-02 DIAGNOSIS — I10 Essential (primary) hypertension: Secondary | ICD-10-CM | POA: Diagnosis not present

## 2017-10-02 DIAGNOSIS — E782 Mixed hyperlipidemia: Secondary | ICD-10-CM | POA: Diagnosis not present

## 2017-12-04 DIAGNOSIS — M25561 Pain in right knee: Secondary | ICD-10-CM | POA: Diagnosis not present

## 2017-12-11 DIAGNOSIS — M25561 Pain in right knee: Secondary | ICD-10-CM | POA: Diagnosis not present

## 2017-12-15 DIAGNOSIS — M25561 Pain in right knee: Secondary | ICD-10-CM | POA: Diagnosis not present

## 2017-12-15 DIAGNOSIS — S83241A Other tear of medial meniscus, current injury, right knee, initial encounter: Secondary | ICD-10-CM | POA: Diagnosis not present

## 2018-01-02 DIAGNOSIS — G8918 Other acute postprocedural pain: Secondary | ICD-10-CM | POA: Diagnosis not present

## 2018-01-02 DIAGNOSIS — M23321 Other meniscus derangements, posterior horn of medial meniscus, right knee: Secondary | ICD-10-CM | POA: Diagnosis not present

## 2018-01-02 DIAGNOSIS — M23221 Derangement of posterior horn of medial meniscus due to old tear or injury, right knee: Secondary | ICD-10-CM | POA: Diagnosis not present

## 2018-02-28 ENCOUNTER — Other Ambulatory Visit: Payer: Self-pay | Admitting: Family Medicine

## 2018-02-28 DIAGNOSIS — E041 Nontoxic single thyroid nodule: Secondary | ICD-10-CM

## 2018-03-12 ENCOUNTER — Ambulatory Visit
Admission: RE | Admit: 2018-03-12 | Discharge: 2018-03-12 | Disposition: A | Discharge status: Home / Self Care | Payer: 59 | Source: Ambulatory Visit | Attending: Family Medicine | Admitting: Family Medicine

## 2018-03-12 DIAGNOSIS — E041 Nontoxic single thyroid nodule: Secondary | ICD-10-CM

## 2018-03-23 DIAGNOSIS — M79601 Pain in right arm: Secondary | ICD-10-CM | POA: Diagnosis not present

## 2018-03-23 DIAGNOSIS — M5412 Radiculopathy, cervical region: Secondary | ICD-10-CM | POA: Diagnosis not present

## 2018-03-27 DIAGNOSIS — E782 Mixed hyperlipidemia: Secondary | ICD-10-CM | POA: Diagnosis not present

## 2018-03-27 DIAGNOSIS — Z0000 Encounter for general adult medical examination without abnormal findings: Secondary | ICD-10-CM | POA: Diagnosis not present

## 2018-04-04 ENCOUNTER — Ambulatory Visit
Admission: RE | Admit: 2018-04-04 | Discharge: 2018-04-04 | Disposition: A | Discharge status: Home / Self Care | Payer: 59 | Source: Ambulatory Visit | Attending: Family Medicine | Admitting: Family Medicine

## 2018-04-04 ENCOUNTER — Other Ambulatory Visit: Payer: Self-pay | Admitting: Family Medicine

## 2018-04-04 DIAGNOSIS — R200 Anesthesia of skin: Secondary | ICD-10-CM

## 2018-04-04 DIAGNOSIS — M4802 Spinal stenosis, cervical region: Secondary | ICD-10-CM | POA: Diagnosis not present

## 2018-04-16 DIAGNOSIS — M5412 Radiculopathy, cervical region: Secondary | ICD-10-CM | POA: Diagnosis not present

## 2018-04-19 DIAGNOSIS — M4802 Spinal stenosis, cervical region: Secondary | ICD-10-CM | POA: Diagnosis not present

## 2018-04-19 DIAGNOSIS — M5412 Radiculopathy, cervical region: Secondary | ICD-10-CM | POA: Diagnosis not present

## 2018-04-23 DIAGNOSIS — R202 Paresthesia of skin: Secondary | ICD-10-CM | POA: Diagnosis not present

## 2018-04-23 DIAGNOSIS — R200 Anesthesia of skin: Secondary | ICD-10-CM | POA: Diagnosis not present

## 2018-05-02 ENCOUNTER — Encounter: Payer: Self-pay | Admitting: Physical Medicine & Rehabilitation

## 2018-05-25 ENCOUNTER — Other Ambulatory Visit: Payer: Self-pay

## 2018-05-25 ENCOUNTER — Ambulatory Visit: Payer: 59 | Admitting: Physical Medicine & Rehabilitation

## 2018-05-25 ENCOUNTER — Encounter: Payer: 59 | Attending: Physical Medicine & Rehabilitation

## 2018-05-25 ENCOUNTER — Encounter: Payer: Self-pay | Admitting: Physical Medicine & Rehabilitation

## 2018-05-25 VITALS — BP 121/78 | HR 69 | Ht 66.0 in | Wt 169.6 lb

## 2018-05-25 DIAGNOSIS — R200 Anesthesia of skin: Secondary | ICD-10-CM | POA: Diagnosis not present

## 2018-05-25 DIAGNOSIS — R202 Paresthesia of skin: Secondary | ICD-10-CM

## 2018-05-25 NOTE — Progress Notes (Deleted)
Electrodiagnostic evidence of Right ulnar neuropathy, please see full report scanned under media  

## 2018-05-25 NOTE — Progress Notes (Deleted)
Electrodiagnostic evidence of Right ulnar neuropathy, please see full report scanned under media

## 2018-07-31 DIAGNOSIS — R350 Frequency of micturition: Secondary | ICD-10-CM | POA: Diagnosis not present

## 2018-07-31 DIAGNOSIS — N3946 Mixed incontinence: Secondary | ICD-10-CM | POA: Diagnosis not present

## 2018-08-09 IMAGING — MG DIGITAL SCREENING BILATERAL MAMMOGRAM WITH CAD
4 series · 4 of 4 positions shown · non-contrast
Comparison: Previous exam(s).

CLINICAL DATA: Screening.

EXAM:
DIGITAL SCREENING BILATERAL MAMMOGRAM WITH CAD

[L CC]
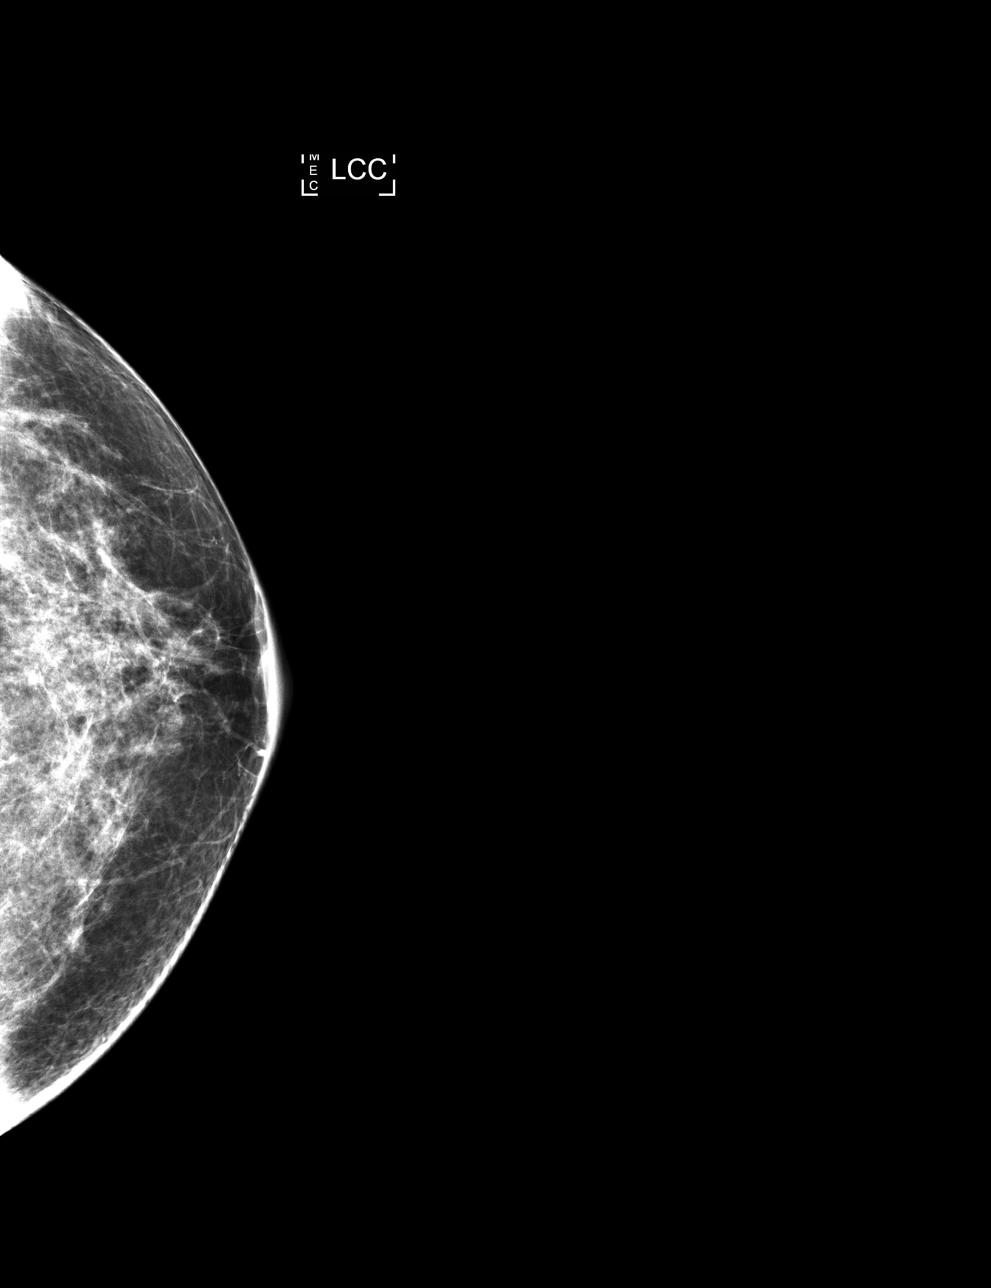

[R CC]
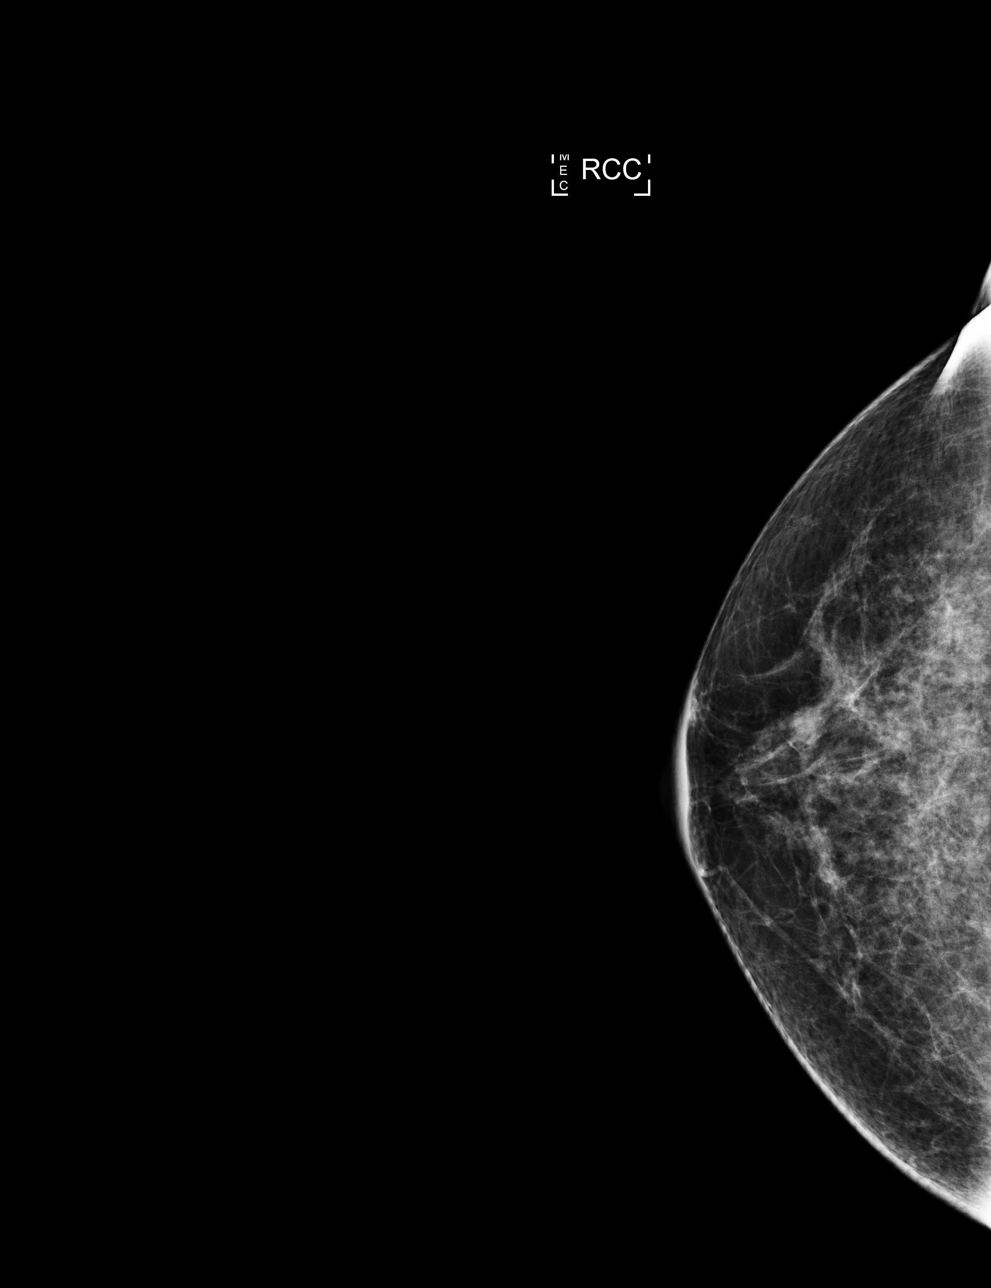

[R MLO]
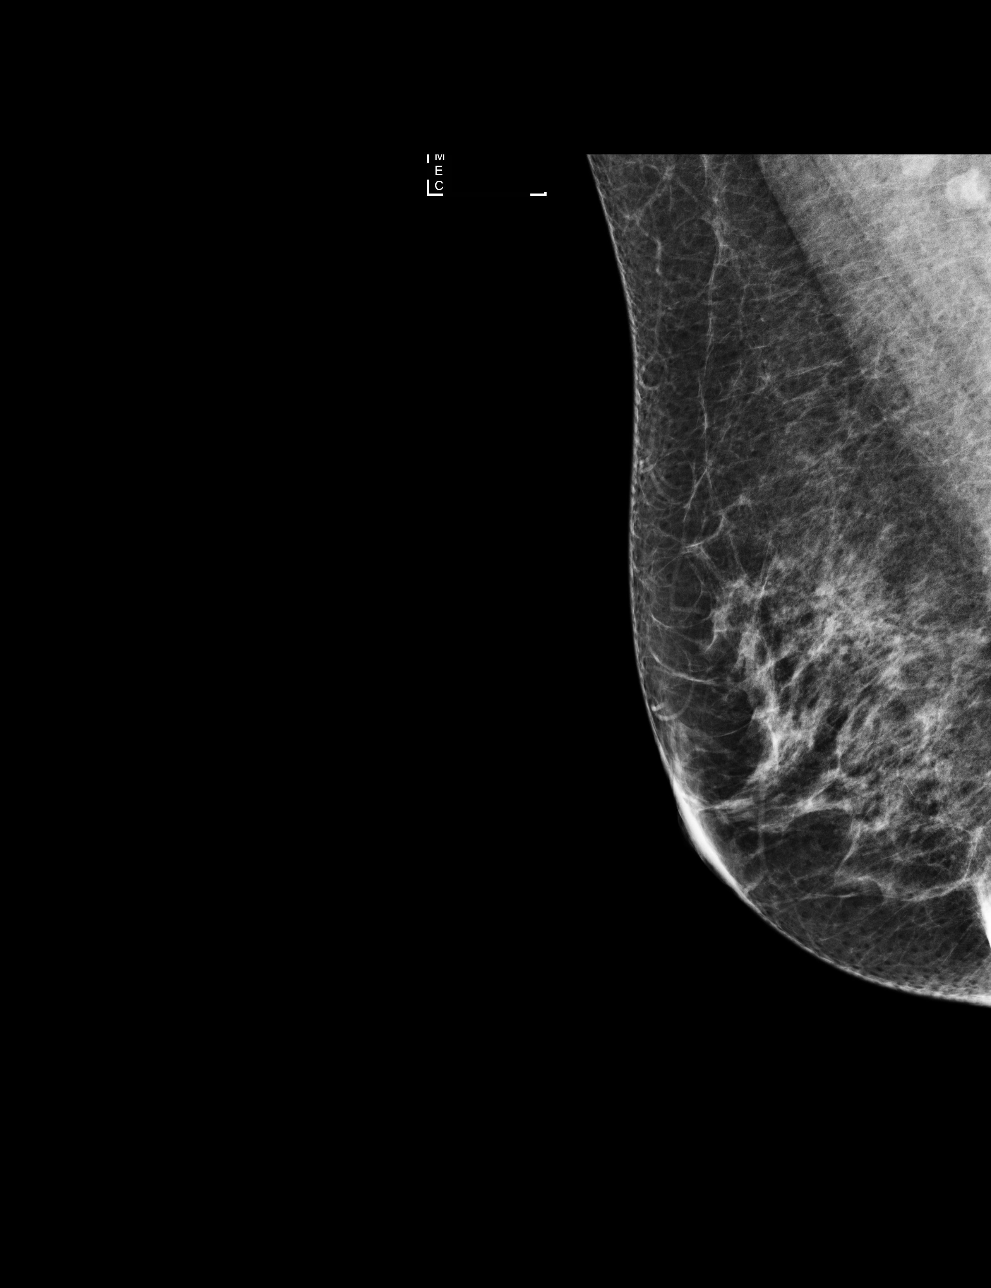

[L MLO]
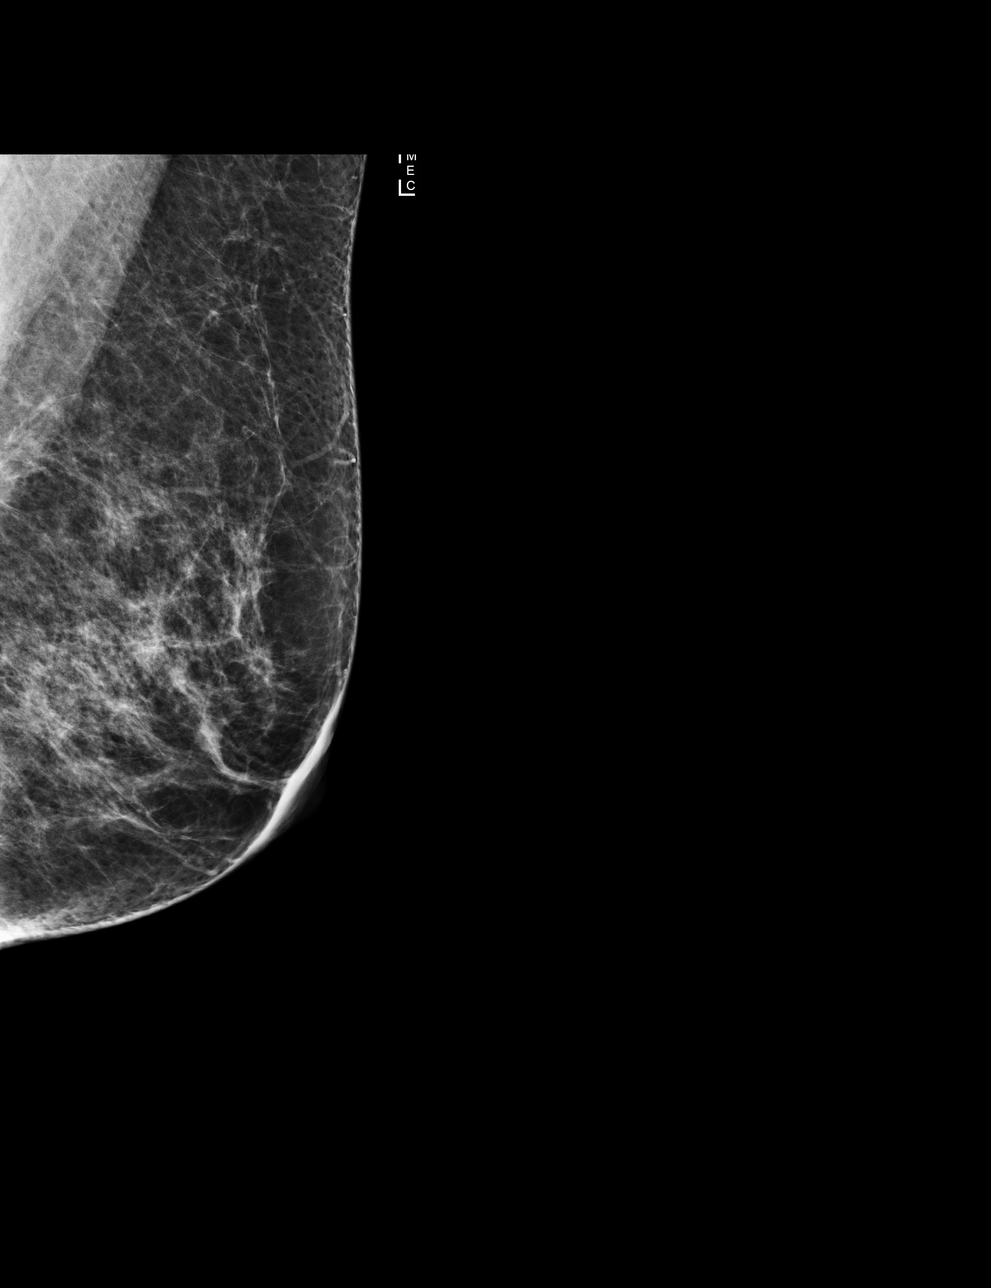

[4 of 4 positions shown; findings below may reference images not displayed]

ACR Breast Density Category b: There are scattered areas of
fibroglandular density.
FINDINGS: There are no findings suspicious for malignancy. Images were
processed with CAD.
IMPRESSION: No mammographic evidence of malignancy. A result letter of this
screening mammogram will be mailed directly to the patient.

RECOMMENDATION:
Screening mammogram in one year. (Code:AS-G-LCT)

BI-RADS CATEGORY  1: Negative.

## 2018-08-24 DIAGNOSIS — J019 Acute sinusitis, unspecified: Secondary | ICD-10-CM | POA: Diagnosis not present

## 2018-10-18 DIAGNOSIS — E782 Mixed hyperlipidemia: Secondary | ICD-10-CM | POA: Diagnosis not present

## 2018-10-18 DIAGNOSIS — I10 Essential (primary) hypertension: Secondary | ICD-10-CM | POA: Diagnosis not present

## 2018-12-07 DIAGNOSIS — R350 Frequency of micturition: Secondary | ICD-10-CM | POA: Diagnosis not present

## 2018-12-14 DIAGNOSIS — R350 Frequency of micturition: Secondary | ICD-10-CM | POA: Diagnosis not present

## 2018-12-21 DIAGNOSIS — R350 Frequency of micturition: Secondary | ICD-10-CM | POA: Diagnosis not present

## 2018-12-28 DIAGNOSIS — R350 Frequency of micturition: Secondary | ICD-10-CM | POA: Diagnosis not present

## 2018-12-28 DIAGNOSIS — N3946 Mixed incontinence: Secondary | ICD-10-CM | POA: Diagnosis not present

## 2019-01-03 DIAGNOSIS — R350 Frequency of micturition: Secondary | ICD-10-CM | POA: Diagnosis not present

## 2019-01-11 DIAGNOSIS — N3946 Mixed incontinence: Secondary | ICD-10-CM | POA: Diagnosis not present

## 2019-01-18 DIAGNOSIS — N3946 Mixed incontinence: Secondary | ICD-10-CM | POA: Diagnosis not present

## 2019-01-18 DIAGNOSIS — R350 Frequency of micturition: Secondary | ICD-10-CM | POA: Diagnosis not present

## 2019-01-24 DIAGNOSIS — R350 Frequency of micturition: Secondary | ICD-10-CM | POA: Diagnosis not present

## 2019-02-01 DIAGNOSIS — R350 Frequency of micturition: Secondary | ICD-10-CM | POA: Diagnosis not present

## 2019-02-08 DIAGNOSIS — R350 Frequency of micturition: Secondary | ICD-10-CM | POA: Diagnosis not present

## 2019-02-15 DIAGNOSIS — R350 Frequency of micturition: Secondary | ICD-10-CM | POA: Diagnosis not present

## 2019-06-01 IMAGING — US US THYROID
1 series · 12 of 25 positions shown · non-contrast
Comparison: 12/17/2015;

CLINICAL DATA: Prior ultrasound follow-up. History of left-sided
thyroid nodule fine-needle aspiration

EXAM:
THYROID ULTRASOUND
TECHNIQUE: Ultrasound examination of the thyroid gland and adjacent soft
tissues was performed.

[Series 1: us thyroid · 0.06mm/px · 12 of 81 slices shown]
[im 4/81]
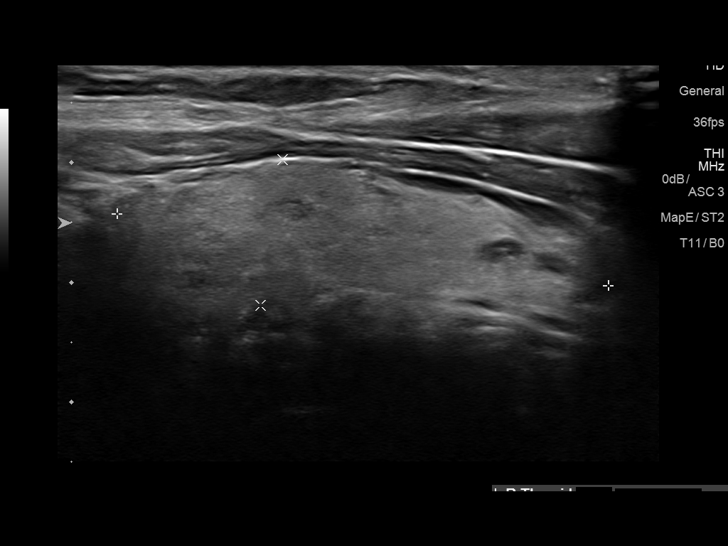
[im 11/81]
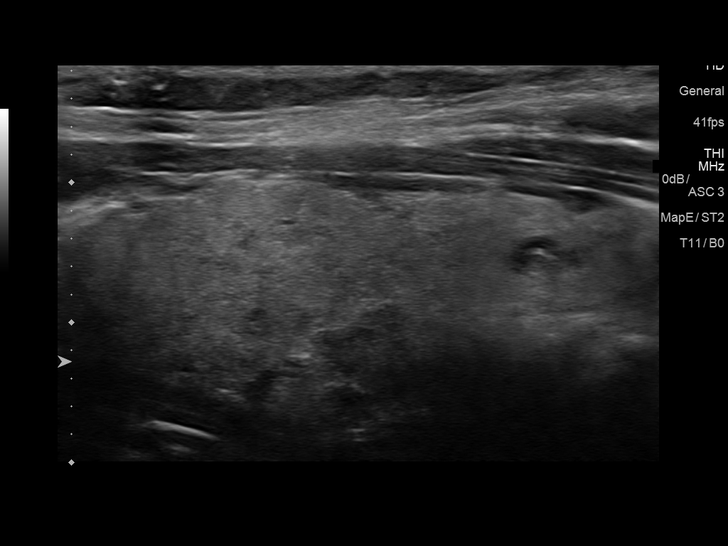
[im 17/81]
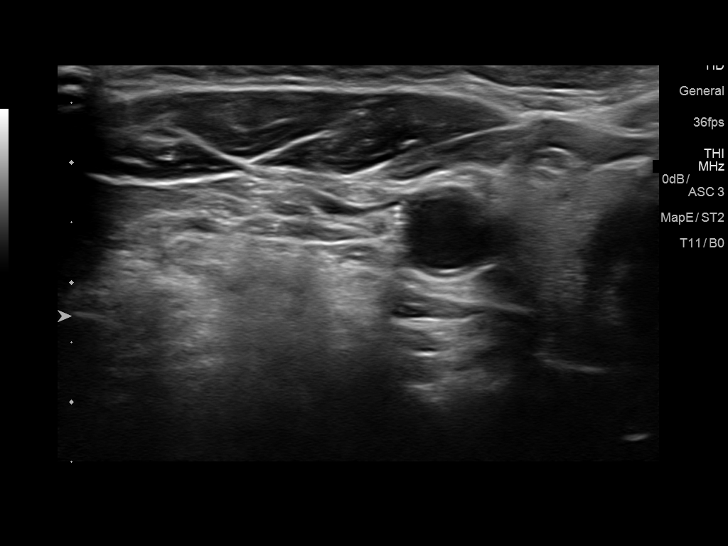
[im 24/81]
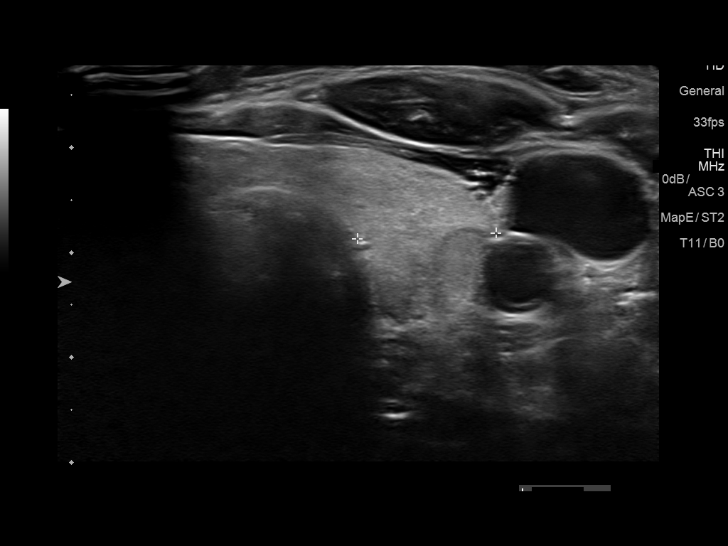
[im 31/81]
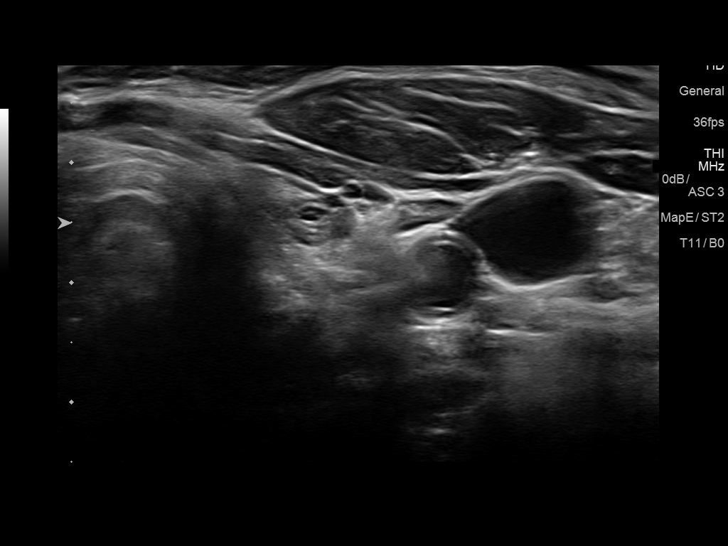
[im 37/81]
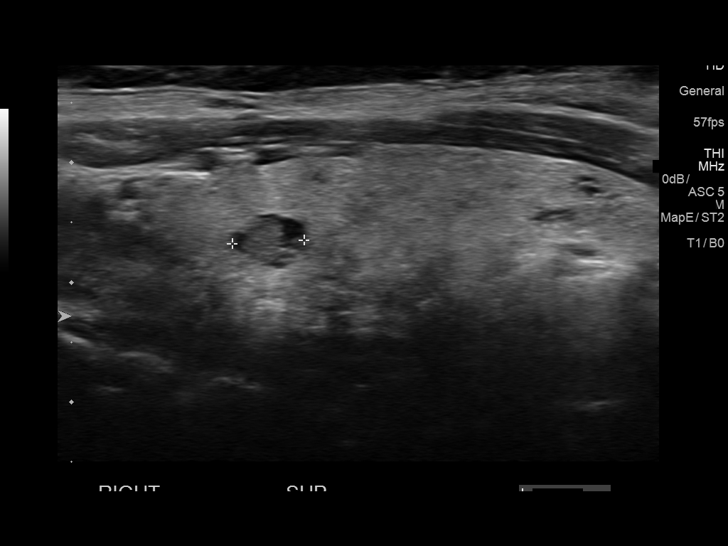
[im 44/81]
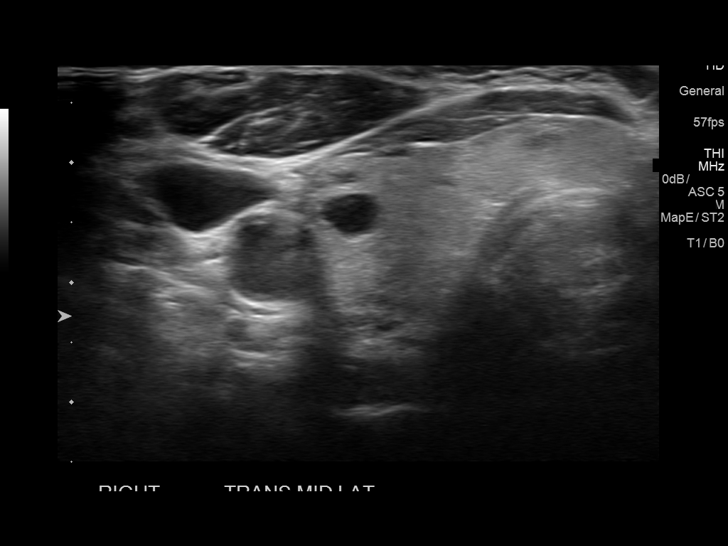
[im 51/81]
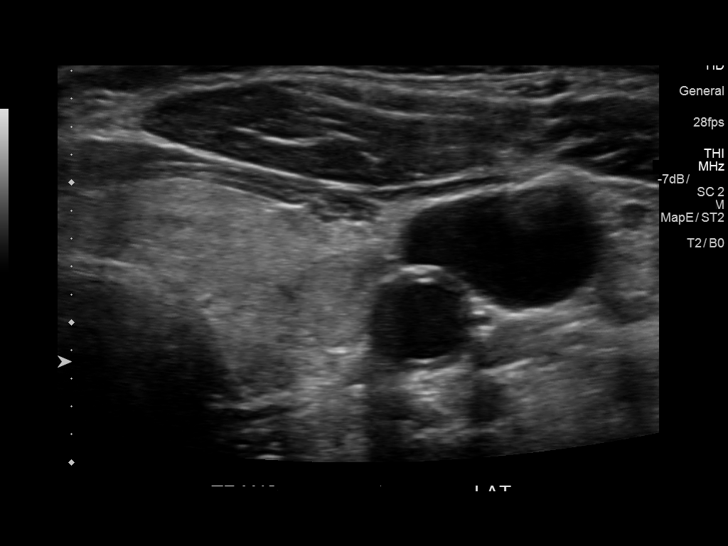
[im 57/81]
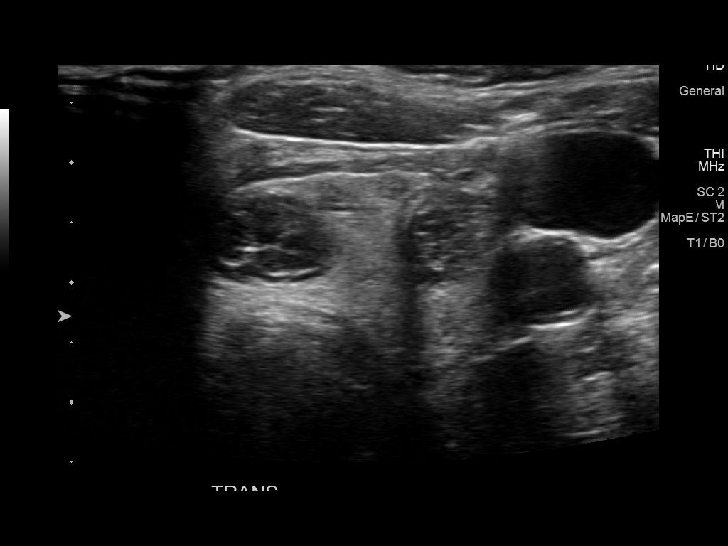
[im 64/81]
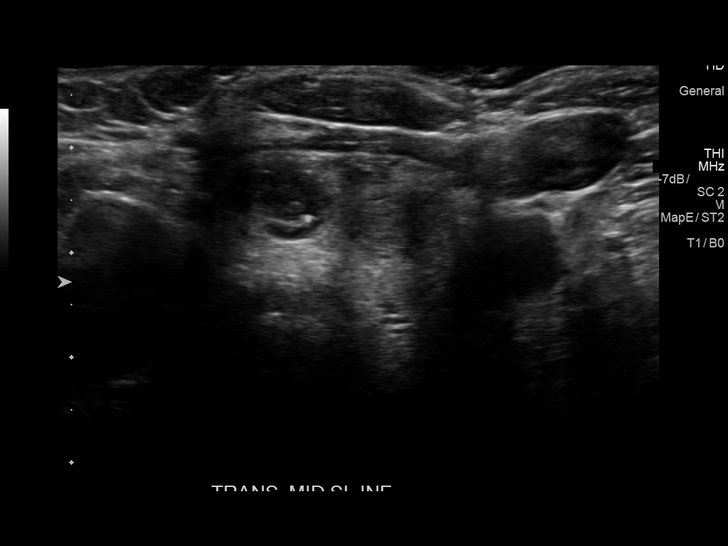
[im 71/81]
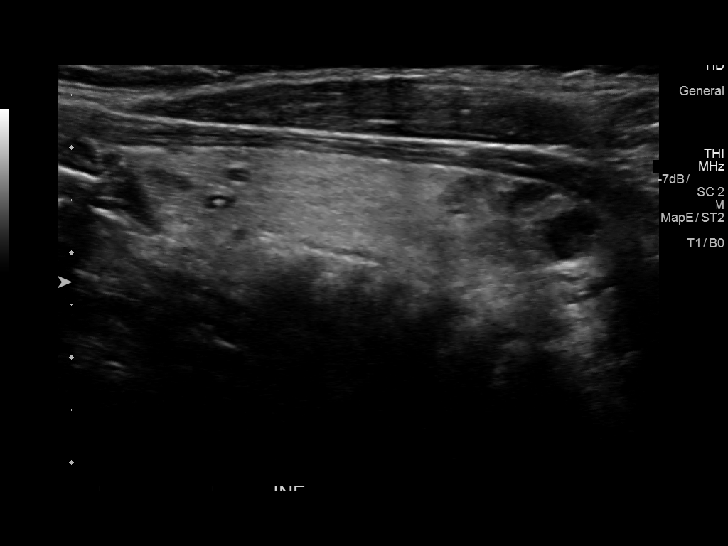
[im 77/81]
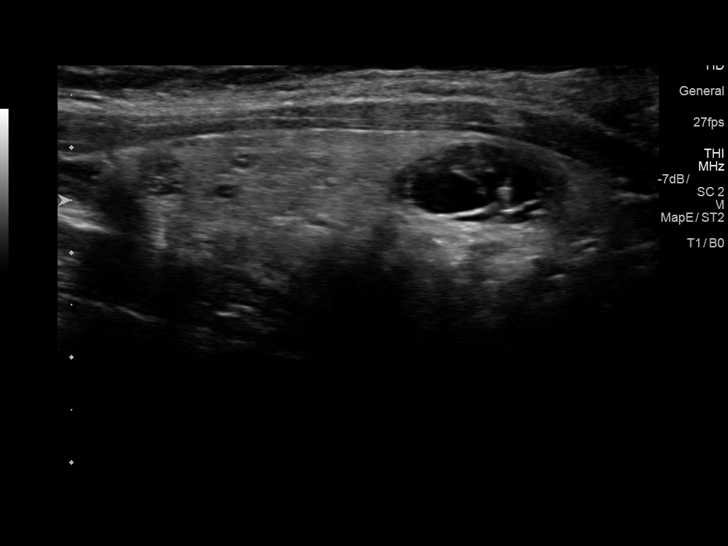

[12 of 25 positions shown; findings below may reference images not displayed]

04/12/2012; ultrasound-guided left-sided
thyroid nodule fine-needle aspiration - 05/23/2012
FINDINGS: Parenchymal Echotexture: Mildly heterogenous

Isthmus: Normal in size measuring 0.5 cm in diameter

Right lobe: Normal in size measuring 4.1 x 1.2 x 1.8 cm, unchanged,
previously, 5.0 x 1.6 x 1.6 cm

Left lobe: Normal in size measuring 4.9 x 1.7 x 1.5 cm, unchanged,
previously, 5.2 x 1.7 x 1.5 cm

_________________________________________________________

Estimated total number of nodules >/= 1 cm: 3

Number of spongiform nodules >/=  2 cm not described below (TR1): 0

Number of mixed cystic and solid nodules >/= 1.5 cm not described
below (TR2): 0

_________________________________________________________

The previously biopsied approximately 1.1 x 0.8 x 0.9 cm spongiform
nodule within the mid / inferior pole of the left lobe of the
thyroid has decreased in size compared to the [DATE] examination,
previously, 1.6 x 1.0 x 1.0 cm correlation with prior biopsy results
is recommended.

Nodule # 4:

Prior biopsy: No

Location: Left; Superior

Maximum size: 1.0 cm; Other 2 dimensions: 0.6 x 0.6 cm, previously,
0.9 x 0.6 x 0.7 cm

Composition: solid/almost completely solid (2)

Echogenicity: isoechoic (1)

Shape: not taller-than-wide (0)

Margins: smooth (0)

Echogenic foci: none (0)

ACR TI-RADS total points: 3.

ACR TI-RADS risk category:  TR3 (3 points).

Significant change in size (>/= 20% in two dimensions and minimal
increase of 2 mm): No

Change in features: No

Change in ACR TI-RADS risk category: No

ACR TI-RADS recommendations:

Given size (<1.4 cm) and appearance, this nodule does NOT meet
TI-RADS criteria for biopsy or dedicated follow-up.

_________________________________________________________

Nodule # 1:

Prior biopsy: No

Location: Isthmus; Mid

Maximum size: 1.6 cm; Other 2 dimensions: 1.2 x 0.7 cm, previously,
1.4 x 1.0 x 0.7 cm

Composition: mixed cystic and solid (1)

Echogenicity: isoechoic (1)

Shape: not taller-than-wide (0)

Margins: smooth (0)

Echogenic foci: none (0)

ACR TI-RADS total points: 2.

ACR TI-RADS risk category:  TR2 (2 points).

Significant change in size (>/= 20% in two dimensions and minimal
increase of 2 mm): No

Change in features: No

Change in ACR TI-RADS risk category: No

ACR TI-RADS recommendations:

This nodule does NOT meet TI-RADS criteria for biopsy or dedicated
follow-up.

_________________________________________________________

There are 2 punctate (sub 6 mm) nodules within the right lobe of the
thyroid, neither of which meet imaging criteria to recommend
percutaneous sampling or dedicated follow-up.

There are 2 punctate sub 9 mm) nodules within the left lobe of the
thyroid, neither of which meet imaging criteria to recommend
percutaneous sampling or dedicated follow-up.
IMPRESSION: 1. Similar findings of multinodular goiter. No definitive new or
enlarging thyroid nodules.
2. Previously biopsied spongiform nodule within the inferior pole
the left lobe of the thyroid has decreased in size compared to the
[DATE] examination. Correlation prior biopsy results is
recommended. Assuming a benign pathologic diagnosis, repeat sampling
or continued dedicated follow-up is not recommended.
3. None of the remaining thyroid nodules meet imaging criteria to
recommend percutaneous sampling or dedicated follow-up.
The above is in keeping with the ACR TI-RADS recommendations - [HOSPITAL] 8711;[DATE].

## 2019-08-27 ENCOUNTER — Other Ambulatory Visit: Payer: Self-pay | Admitting: Family Medicine

## 2019-08-27 ENCOUNTER — Other Ambulatory Visit (HOSPITAL_COMMUNITY)
Admission: RE | Admit: 2019-08-27 | Discharge: 2019-08-27 | Disposition: A | Discharge status: Home / Self Care | Payer: 59 | Source: Ambulatory Visit | Attending: Family Medicine | Admitting: Family Medicine

## 2019-08-27 DIAGNOSIS — Z124 Encounter for screening for malignant neoplasm of cervix: Secondary | ICD-10-CM | POA: Insufficient documentation

## 2019-08-27 DIAGNOSIS — Z1231 Encounter for screening mammogram for malignant neoplasm of breast: Secondary | ICD-10-CM

## 2019-08-30 LAB — CYTOLOGY - PAP
Comment: NEGATIVE
Diagnosis: NEGATIVE
High risk HPV: NEGATIVE

## 2019-10-18 ENCOUNTER — Ambulatory Visit
Admission: RE | Admit: 2019-10-18 | Discharge: 2019-10-18 | Disposition: A | Discharge status: Home / Self Care | Payer: 59 | Source: Ambulatory Visit | Attending: Family Medicine | Admitting: Family Medicine

## 2019-10-18 ENCOUNTER — Other Ambulatory Visit: Payer: Self-pay

## 2019-10-18 DIAGNOSIS — Z1231 Encounter for screening mammogram for malignant neoplasm of breast: Secondary | ICD-10-CM

## 2020-10-08 ENCOUNTER — Other Ambulatory Visit: Payer: Self-pay | Admitting: Family Medicine

## 2020-10-08 DIAGNOSIS — E041 Nontoxic single thyroid nodule: Secondary | ICD-10-CM

## 2020-10-14 ENCOUNTER — Other Ambulatory Visit: Payer: Self-pay | Admitting: Family Medicine

## 2020-10-14 DIAGNOSIS — Z1231 Encounter for screening mammogram for malignant neoplasm of breast: Secondary | ICD-10-CM

## 2020-10-14 DIAGNOSIS — E2839 Other primary ovarian failure: Secondary | ICD-10-CM

## 2020-10-21 ENCOUNTER — Ambulatory Visit
Admission: RE | Admit: 2020-10-21 | Discharge: 2020-10-21 | Disposition: A | Discharge status: Home / Self Care | Payer: 59 | Source: Ambulatory Visit | Attending: Family Medicine | Admitting: Family Medicine

## 2020-10-21 DIAGNOSIS — E041 Nontoxic single thyroid nodule: Secondary | ICD-10-CM

## 2020-11-27 ENCOUNTER — Ambulatory Visit
Admission: RE | Admit: 2020-11-27 | Discharge: 2020-11-27 | Disposition: A | Discharge status: Home / Self Care | Payer: 59 | Source: Ambulatory Visit | Attending: Family Medicine | Admitting: Family Medicine

## 2020-11-27 ENCOUNTER — Other Ambulatory Visit: Payer: Self-pay

## 2020-11-27 DIAGNOSIS — Z1231 Encounter for screening mammogram for malignant neoplasm of breast: Secondary | ICD-10-CM

## 2020-12-02 ENCOUNTER — Other Ambulatory Visit: Payer: Self-pay | Admitting: Family Medicine

## 2020-12-02 DIAGNOSIS — R928 Other abnormal and inconclusive findings on diagnostic imaging of breast: Secondary | ICD-10-CM

## 2021-02-19 ENCOUNTER — Other Ambulatory Visit: Payer: 59

## 2021-10-25 ENCOUNTER — Other Ambulatory Visit: Payer: Self-pay | Admitting: Family Medicine

## 2021-10-25 DIAGNOSIS — Z13820 Encounter for screening for osteoporosis: Secondary | ICD-10-CM

## 2021-11-29 ENCOUNTER — Other Ambulatory Visit: Payer: Self-pay | Admitting: Family Medicine

## 2021-11-29 DIAGNOSIS — R928 Other abnormal and inconclusive findings on diagnostic imaging of breast: Secondary | ICD-10-CM

## 2021-12-01 ENCOUNTER — Other Ambulatory Visit: Payer: 59

## 2021-12-22 ENCOUNTER — Ambulatory Visit
Admission: RE | Admit: 2021-12-22 | Discharge: 2021-12-22 | Disposition: A | Discharge status: Home / Self Care | Payer: 59 | Source: Ambulatory Visit | Attending: Family Medicine | Admitting: Family Medicine

## 2021-12-22 DIAGNOSIS — R928 Other abnormal and inconclusive findings on diagnostic imaging of breast: Secondary | ICD-10-CM

## 2022-04-06 ENCOUNTER — Other Ambulatory Visit: Payer: 59

## 2022-11-14 ENCOUNTER — Other Ambulatory Visit: Payer: Self-pay | Admitting: Family Medicine

## 2022-11-14 DIAGNOSIS — E2839 Other primary ovarian failure: Secondary | ICD-10-CM

## 2023-06-30 ENCOUNTER — Ambulatory Visit (HOSPITAL_COMMUNITY)
Admission: EM | Admit: 2023-06-30 | Discharge: 2023-06-30 | Disposition: A | Discharge status: Home / Self Care | Payer: 59

## 2023-06-30 ENCOUNTER — Encounter (HOSPITAL_COMMUNITY): Payer: Self-pay | Admitting: *Deleted

## 2023-06-30 DIAGNOSIS — I161 Hypertensive emergency: Secondary | ICD-10-CM

## 2023-06-30 DIAGNOSIS — R112 Nausea with vomiting, unspecified: Secondary | ICD-10-CM | POA: Diagnosis not present

## 2023-06-30 DIAGNOSIS — I10 Essential (primary) hypertension: Secondary | ICD-10-CM | POA: Diagnosis not present

## 2023-06-30 DIAGNOSIS — R519 Headache, unspecified: Secondary | ICD-10-CM | POA: Diagnosis not present

## 2023-06-30 MED ORDER — ONDANSETRON 4 MG PO TBDP
ORAL_TABLET | ORAL | Status: AC
  Filled 2023-06-30: qty 1

## 2023-06-30 MED ORDER — ONDANSETRON 4 MG PO TBDP
4.0000 mg | ORAL_TABLET | Freq: Once | ORAL | Status: AC
  Administered 2023-06-30: 4 mg via ORAL

## 2023-06-30 NOTE — ED Provider Notes (Signed)
[facility]    CSN: [id number] Arrival date & time: 06/30/23  0803      History   Chief Complaint Chief Complaint  Patient presents with   Emesis   Headache   Nausea    HPI Deborah [name] is a 67 y.o. female.   Patient presents today with a 2-day history of intractable nausea and vomiting.  Reports she has been unable to take even small sips of water without recurrent emesis.  She has not tried any over-the-counter medications.  She did initially have some abdominal cramping and diarrhea but the symptoms have resolved and she is now just experiencing headache, nausea, vomiting.  She denies any suspicious food intake, known sick contacts, recent travel, history of gastrointestinal disorder.  She does have a history of migraines but reports current symptoms are severe and lasted much longer than her typical migraines.  She has tried over-the-counter medications without improvement of symptoms.  She denies any recent head trauma.  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  She denies any chest pain, shortness of breath, weakness.  Her blood pressure is very elevated today and she reports this is because she has been unable to take her medication for the past several days.      Past Medical History:  Diagnosis Date   Bigeminy    Depression    Hypertension     There are no problems to display for this patient.   Past Surgical History:  Procedure Laterality Date   CATARACT EXTRACTION Bilateral    CERVICAL DISCECTOMY  09/26/2008    OB History   No obstetric history on file.      Home Medications    Prior to Admission medications   Medication Sig Start Date End Date Taking? Authorizing Provider  amLODipine (NORVASC) 10 MG tablet Take 10 mg by mouth daily.   Yes [provider]  desvenlafaxine (PRISTIQ) 100 MG 24 hr tablet Take 100 mg by mouth daily.    Yes [provider]    Family History Family History  Problem Relation Age of Onset   Breast cancer Neg Hx     Social History Social History   Tobacco Use   Smoking status: Never   Smokeless tobacco: Never  Vaping Use   Vaping status: Never Used  Substance Use Topics   Alcohol use: Yes    Comment: occasion   Drug use: No     Allergies   Codeine, Mirtazapine, and Sulfa antibiotics   Review of Systems Review of Systems  Constitutional:  Positive for activity change. Negative for appetite change, fatigue and fever.  HENT:  Negative for congestion and sore throat.   Eyes:  Positive for photophobia. Negative for visual disturbance.  Respiratory:  Negative for cough and shortness of breath.   Cardiovascular:  Negative for chest pain.  Gastrointestinal:  Positive for diarrhea, nausea and vomiting. Negative for abdominal pain.  Neurological:  Positive for headaches. Negative for dizziness, seizures, syncope, speech difficulty, weakness and light-headedness.     Physical Exam Triage Vital Signs ED Triage Vitals  Encounter Vitals Group     BP 06/30/23 0828 (!) 179/87     Systolic BP Percentile --      Diastolic BP Percentile --      Pulse Rate 06/30/23 0828 87     Resp 06/30/23 0828 18     Temp  06/30/23 0828 98.1 F (36.7 C)     Temp Source 06/30/23 0828 Oral     SpO2 06/30/23 0828 97 %     Weight --      Height --      Head Circumference --      Peak Flow --      Pain Score 06/30/23 0825 10     Pain Loc --      Pain Education --      Exclude from Growth Chart --    No data found.  Updated Vital Signs BP (!) 180/90   Pulse 87   Temp 98.1 F (36.7 C) (Oral)   Resp 18   SpO2 97%   Visual Acuity Right Eye Distance:   Left Eye Distance:   Bilateral Distance:    Right Eye Near:   Left Eye Near:    Bilateral Near:     Physical Exam Vitals reviewed.  Constitutional:      General: She is awake. She is not in acute distress.    Appearance:  Normal appearance. She is well-developed. She is not ill-appearing.     Comments: Very pleasant female appears stated age sitting in a darkened room holding emesis bag but in no acute distress  HENT:     Head: Normocephalic and atraumatic.     Right Ear: External ear normal.     Left Ear: External ear normal.     Mouth/Throat:     Dentition: Abnormal dentition.  Cardiovascular:     Rate and Rhythm: Normal rate and regular rhythm.     Heart sounds: Normal heart sounds, S1 normal and S2 normal. No murmur heard. Pulmonary:     Effort: Pulmonary effort is normal.     Breath sounds: Normal breath sounds. No wheezing, rhonchi or rales.     Comments: Clear to auscultation bilaterally Abdominal:     Palpations: Abdomen is soft.     Tenderness: There is no abdominal tenderness.  Musculoskeletal:     Comments: Strength 5/5 bilateral upper and lower extremities  Psychiatric:        Behavior: Behavior is cooperative.      UC Treatments / Results  Labs (all labs ordered are listed, but only abnormal results are displayed) Labs Reviewed - No data to display  EKG   Radiology No results found.  Procedures Procedures (including critical care time)  Medications Ordered in UC Medications  ondansetron (ZOFRAN-ODT) disintegrating tablet 4 mg (4 mg Oral Given 06/30/23 0848)    Initial Impression / Assessment and Plan / UC Course  I have reviewed the triage vital signs and the nursing notes.  Pertinent labs & imaging results that were available during my care of the patient were reviewed by me and considered in my medical decision making (see chart for details).     Patient is well-appearing, afebrile, nontoxic, nontachycardic.  She does have significantly elevated blood pressure and we discussed that given this is the worst headache of her life with elevated blood pressure I did recommend she go to the emergency room.  She was agreeable.  She was having significant nausea and vomiting as  well as given a dose of Zofran with some improvement of symptoms.  While she did have improvement of her GI symptoms which did result in an improvement of her headache pain to approximately 8 on a certain pain scale given this continued to be a severe headache in the setting of significantly elevated blood pressure ultimately she was  sent to the emergency room for further evaluation and management.  Patient was stable to time of discharge.  Final Clinical Impressions(s) / UC Diagnoses   Final diagnoses:  Severe headache  Nausea and vomiting, unspecified vomiting type  Elevated blood pressure reading in office with diagnosis of hypertension  Hypertensive emergency     Discharge Instructions      Given the severity of your headache and your elevated blood pressure I do recommend you go to the emergency room.     ED Prescriptions   None    PDMP not reviewed this encounter.   Jeani Hawking, PA-C 06/30/23 1610

## 2023-06-30 NOTE — Discharge Instructions (Signed)
Given the severity of your headache and your elevated blood pressure I do recommend you go to the emergency room.

## 2023-06-30 NOTE — ED Notes (Signed)
Patient is being discharged from the Urgent Care and sent to the Emergency Department via POV . Per Dorann Ou, PA-C, patient is in need of higher level of care due to headache, elevated BP. Patient is aware and verbalizes understanding of plan of care.  Vitals:   06/30/23 0828 06/30/23 0914  BP: (!) 179/87 (!) 180/90  Pulse: 87   Resp: 18   Temp: 98.1 F (36.7 C)   SpO2: 97%

## 2023-06-30 NOTE — ED Triage Notes (Signed)
Pt states nausea started 2 days ago and she started vomiting yesterday. She is now having a headache. She took some gasx yesterday.

## 2023-12-14 ENCOUNTER — Other Ambulatory Visit: Payer: Self-pay | Admitting: Family Medicine

## 2023-12-14 ENCOUNTER — Encounter: Payer: Self-pay | Admitting: Family Medicine

## 2023-12-14 DIAGNOSIS — E2839 Other primary ovarian failure: Secondary | ICD-10-CM

## 2023-12-14 DIAGNOSIS — Z1231 Encounter for screening mammogram for malignant neoplasm of breast: Secondary | ICD-10-CM

## 2024-08-21 ENCOUNTER — Other Ambulatory Visit
